# Patient Record
Sex: Female | Born: 1960 | Race: White | Hispanic: No | Marital: Married | Smoking: Never smoker
Health system: Southern US, Community
[De-identification: ages and names within clinical notes are randomized; demographics above are authoritative.]

## PROBLEM LIST (undated history)

## (undated) DIAGNOSIS — M75120 Complete rotator cuff tear or rupture of unspecified shoulder, not specified as traumatic: Secondary | ICD-10-CM

## (undated) DIAGNOSIS — Z889 Allergy status to unspecified drugs, medicaments and biological substances status: Secondary | ICD-10-CM

## (undated) DIAGNOSIS — E119 Type 2 diabetes mellitus without complications: Secondary | ICD-10-CM

## (undated) DIAGNOSIS — I10 Essential (primary) hypertension: Secondary | ICD-10-CM

## (undated) DIAGNOSIS — F419 Anxiety disorder, unspecified: Secondary | ICD-10-CM

## (undated) DIAGNOSIS — I8393 Asymptomatic varicose veins of bilateral lower extremities: Secondary | ICD-10-CM

## (undated) DIAGNOSIS — R112 Nausea with vomiting, unspecified: Secondary | ICD-10-CM

## (undated) DIAGNOSIS — Z9889 Other specified postprocedural states: Secondary | ICD-10-CM

## (undated) DIAGNOSIS — K219 Gastro-esophageal reflux disease without esophagitis: Secondary | ICD-10-CM

## (undated) DIAGNOSIS — E7800 Pure hypercholesterolemia, unspecified: Secondary | ICD-10-CM

## (undated) HISTORY — PX: HAND SURGERY: SHX662

## (undated) HISTORY — PX: WISDOM TOOTH EXTRACTION: SHX21

## (undated) HISTORY — PX: CARDIAC CATHETERIZATION: SHX172

---

## 2005-09-10 ENCOUNTER — Ambulatory Visit: Payer: Self-pay | Admitting: Unknown Physician Specialty

## 2007-05-14 ENCOUNTER — Ambulatory Visit: Payer: Self-pay | Admitting: Unknown Physician Specialty

## 2008-05-19 ENCOUNTER — Ambulatory Visit: Payer: Self-pay | Admitting: Unknown Physician Specialty

## 2009-08-09 ENCOUNTER — Ambulatory Visit: Payer: Self-pay | Admitting: Unknown Physician Specialty

## 2009-11-30 ENCOUNTER — Ambulatory Visit: Payer: Self-pay | Admitting: Gastroenterology

## 2010-09-11 ENCOUNTER — Ambulatory Visit: Payer: Self-pay | Admitting: Unknown Physician Specialty

## 2010-10-01 ENCOUNTER — Ambulatory Visit: Payer: Self-pay | Admitting: Cardiology

## 2011-07-08 ENCOUNTER — Emergency Department: Payer: Self-pay | Admitting: Emergency Medicine

## 2011-11-11 ENCOUNTER — Ambulatory Visit: Payer: Self-pay | Admitting: Unknown Physician Specialty

## 2012-11-11 ENCOUNTER — Ambulatory Visit: Payer: Self-pay | Admitting: Unknown Physician Specialty

## 2013-11-15 ENCOUNTER — Ambulatory Visit: Payer: Self-pay | Admitting: Internal Medicine

## 2014-11-24 ENCOUNTER — Ambulatory Visit: Payer: Self-pay | Admitting: Internal Medicine

## 2014-12-01 ENCOUNTER — Ambulatory Visit: Payer: Self-pay | Admitting: Otolaryngology

## 2015-08-10 ENCOUNTER — Other Ambulatory Visit: Payer: Self-pay | Admitting: Surgery

## 2015-08-10 DIAGNOSIS — M75101 Unspecified rotator cuff tear or rupture of right shoulder, not specified as traumatic: Secondary | ICD-10-CM

## 2015-08-24 ENCOUNTER — Ambulatory Visit: Payer: Self-pay

## 2015-08-29 ENCOUNTER — Ambulatory Visit
Admission: RE | Admit: 2015-08-29 | Discharge: 2015-08-29 | Disposition: A | Discharge status: Home / Self Care | Payer: BLUE CROSS/BLUE SHIELD | Source: Ambulatory Visit | Attending: Surgery | Admitting: Surgery

## 2015-08-29 DIAGNOSIS — M75101 Unspecified rotator cuff tear or rupture of right shoulder, not specified as traumatic: Secondary | ICD-10-CM

## 2015-08-29 DIAGNOSIS — M67813 Other specified disorders of tendon, right shoulder: Secondary | ICD-10-CM | POA: Diagnosis not present

## 2015-08-29 DIAGNOSIS — M25511 Pain in right shoulder: Secondary | ICD-10-CM | POA: Diagnosis present

## 2015-08-29 DIAGNOSIS — M75121 Complete rotator cuff tear or rupture of right shoulder, not specified as traumatic: Secondary | ICD-10-CM | POA: Insufficient documentation

## 2015-09-21 ENCOUNTER — Encounter
Admission: RE | Admit: 2015-09-21 | Discharge: 2015-09-21 | Disposition: A | Discharge status: Home / Self Care | Payer: BLUE CROSS/BLUE SHIELD | Source: Ambulatory Visit | Attending: Surgery | Admitting: Surgery

## 2015-09-21 DIAGNOSIS — Z01810 Encounter for preprocedural cardiovascular examination: Secondary | ICD-10-CM | POA: Insufficient documentation

## 2015-09-21 DIAGNOSIS — Z01812 Encounter for preprocedural laboratory examination: Secondary | ICD-10-CM | POA: Diagnosis not present

## 2015-09-21 HISTORY — DX: Allergy status to unspecified drugs, medicaments and biological substances: Z88.9

## 2015-09-21 HISTORY — DX: Type 2 diabetes mellitus without complications: E11.9

## 2015-09-21 HISTORY — DX: Anxiety disorder, unspecified: F41.9

## 2015-09-21 HISTORY — DX: Gastro-esophageal reflux disease without esophagitis: K21.9

## 2015-09-21 HISTORY — DX: Essential (primary) hypertension: I10

## 2015-09-21 LAB — CBC
HCT: 42.6 % (ref 35.0–47.0)
Hemoglobin: 14 g/dL (ref 12.0–16.0)
MCH: 29.5 pg (ref 26.0–34.0)
MCHC: 32.8 g/dL (ref 32.0–36.0)
MCV: 90 fL (ref 80.0–100.0)
PLATELETS: 221 10*3/uL (ref 150–440)
RBC: 4.73 MIL/uL (ref 3.80–5.20)
RDW: 13.3 % (ref 11.5–14.5)
WBC: 10.8 10*3/uL (ref 3.6–11.0)

## 2015-09-21 LAB — BASIC METABOLIC PANEL
Anion gap: 10 (ref 5–15)
BUN: 24 mg/dL — AB (ref 6–20)
CHLORIDE: 102 mmol/L (ref 101–111)
CO2: 28 mmol/L (ref 22–32)
CREATININE: 0.91 mg/dL (ref 0.44–1.00)
Calcium: 9.8 mg/dL (ref 8.9–10.3)
GFR calc Af Amer: >60 mL/min (ref >60)
GLUCOSE: 122 mg/dL — AB (ref 65–99)
POTASSIUM: 3.8 mmol/L (ref 3.5–5.1)
Sodium: 140 mmol/L (ref 135–145)

## 2015-09-21 NOTE — Patient Instructions (Signed)
  Your procedure is scheduled on: September 28, 2015 (Thursday) Report to Day Surgery.[facility]) To find out your arrival time please call [phone] between 1PM - 3PM on September 27, 2015 (Wednesday).  Remember: Instructions that are not followed completely may result in serious medical risk, up to and including death, or upon the discretion of your surgeon and anesthesiologist your surgery may need to be rescheduled.    _x___ 1. Do not eat food or drink liquids after midnight. No gum chewing or hard candies.     _x___ 2. No Alcohol for 24 hours before or after surgery.   ____ 3. Bring all medications with you on the day of surgery if instructed.    _x___ 4. Notify your doctor if there is any change in your medical condition     (cold, fever, infections).     Do not wear jewelry, make-up, hairpins, clips or nail polish.  Do not wear lotions, powders, or perfumes. You may wear deodorant.  Do not shave 48 hours prior to surgery. Men may shave face and neck.  Do not bring valuables to the hospital.    [facility] is not responsible for any belongings or valuables.               Contacts, dentures or bridgework may not be worn into surgery.  Leave your suitcase in the car. After surgery it may be brought to your room.  For patients admitted to the hospital, discharge time is determined by your                treatment team.   Patients discharged the day of surgery will not be allowed to drive home.   Please read over the following fact sheets that you were given:   Surgical Site Infection Prevention   ____ Take these medicines the morning of surgery with A SIP OF WATER:    1. Metoprolol  2. Simvastatin  3. Ranitidine  4.Valsartan  5.  6.  ____ Fleet Enema (as directed)   __x__ Use CHG Soap as directed  ____ Use inhalers on the day of surgery  ____ Stop metformin 2 days prior to surgery    __x__ Take 1/2 of usual insulin dose the night before surgery and none on the  morning of surgery. (LEAVE INSULIN PUMP AS USUAL)  __x__ Stop Coumadin/Plavix/aspirin on (Patient has stopped Aspirin)  __x__ Stop Anti-inflammatories on (Tylenol ok to take for pain)   ____ Stop supplements until after surgery.    ____ Bring C-Pap to the hospital.

## 2015-09-21 NOTE — Pre-Procedure Instructions (Addendum)
As instructed by Dr Henrene HawkingKephart, request for medical clearance regarding ekg called and faxed to Dr Algis LimingJ Singh. Spoke with  [employer]. Also info to Dr Joice LoftsPoggi and [facility]

## 2015-09-23 LAB — LATEX, IGE: Latex: <0.1 kU/L

## 2015-09-27 NOTE — Pre-Procedure Instructions (Signed)
Spoke with Dawn at Dr Thedore MinsSingh office re status of clearance

## 2015-09-28 ENCOUNTER — Ambulatory Visit: Payer: BLUE CROSS/BLUE SHIELD | Admitting: Anesthesiology

## 2015-09-28 ENCOUNTER — Ambulatory Visit
Admission: RE | Admit: 2015-09-28 | Discharge: 2015-09-28 | Disposition: A | Discharge status: Home / Self Care | Payer: BLUE CROSS/BLUE SHIELD | Source: Ambulatory Visit | Attending: Surgery | Admitting: Surgery

## 2015-09-28 ENCOUNTER — Encounter: Payer: Self-pay | Admitting: Anesthesiology

## 2015-09-28 ENCOUNTER — Encounter
Admission: RE | Disposition: A | Discharge status: Home / Self Care | Payer: Self-pay | Source: Ambulatory Visit | Attending: Surgery

## 2015-09-28 DIAGNOSIS — E7800 Pure hypercholesterolemia, unspecified: Secondary | ICD-10-CM | POA: Insufficient documentation

## 2015-09-28 DIAGNOSIS — Z87891 Personal history of nicotine dependence: Secondary | ICD-10-CM | POA: Diagnosis not present

## 2015-09-28 DIAGNOSIS — Z91048 Other nonmedicinal substance allergy status: Secondary | ICD-10-CM | POA: Insufficient documentation

## 2015-09-28 DIAGNOSIS — E785 Hyperlipidemia, unspecified: Secondary | ICD-10-CM | POA: Diagnosis not present

## 2015-09-28 DIAGNOSIS — N183 Chronic kidney disease, stage 3 (moderate): Secondary | ICD-10-CM | POA: Insufficient documentation

## 2015-09-28 DIAGNOSIS — K219 Gastro-esophageal reflux disease without esophagitis: Secondary | ICD-10-CM | POA: Insufficient documentation

## 2015-09-28 DIAGNOSIS — Z9641 Presence of insulin pump (external) (internal): Secondary | ICD-10-CM | POA: Diagnosis not present

## 2015-09-28 DIAGNOSIS — E1022 Type 1 diabetes mellitus with diabetic chronic kidney disease: Secondary | ICD-10-CM | POA: Insufficient documentation

## 2015-09-28 DIAGNOSIS — S46111A Strain of muscle, fascia and tendon of long head of biceps, right arm, initial encounter: Secondary | ICD-10-CM | POA: Diagnosis not present

## 2015-09-28 DIAGNOSIS — Z888 Allergy status to other drugs, medicaments and biological substances status: Secondary | ICD-10-CM | POA: Diagnosis not present

## 2015-09-28 DIAGNOSIS — W19XXXA Unspecified fall, initial encounter: Secondary | ICD-10-CM | POA: Diagnosis not present

## 2015-09-28 DIAGNOSIS — Z881 Allergy status to other antibiotic agents status: Secondary | ICD-10-CM | POA: Diagnosis not present

## 2015-09-28 DIAGNOSIS — I129 Hypertensive chronic kidney disease with stage 1 through stage 4 chronic kidney disease, or unspecified chronic kidney disease: Secondary | ICD-10-CM | POA: Insufficient documentation

## 2015-09-28 DIAGNOSIS — Z7982 Long term (current) use of aspirin: Secondary | ICD-10-CM | POA: Insufficient documentation

## 2015-09-28 DIAGNOSIS — M75101 Unspecified rotator cuff tear or rupture of right shoulder, not specified as traumatic: Secondary | ICD-10-CM | POA: Insufficient documentation

## 2015-09-28 DIAGNOSIS — M7541 Impingement syndrome of right shoulder: Secondary | ICD-10-CM | POA: Insufficient documentation

## 2015-09-28 HISTORY — DX: Pure hypercholesterolemia, unspecified: E78.00

## 2015-09-28 HISTORY — PX: SHOULDER ARTHROSCOPY WITH OPEN ROTATOR CUFF REPAIR: SHX6092

## 2015-09-28 LAB — GLUCOSE, CAPILLARY
GLUCOSE-CAPILLARY: 167 mg/dL — AB (ref 65–99)
GLUCOSE-CAPILLARY: 239 mg/dL — AB (ref 65–99)

## 2015-09-28 SURGERY — ARTHROSCOPY, SHOULDER WITH REPAIR, ROTATOR CUFF, OPEN
Anesthesia: General | Site: Shoulder | Laterality: Right | Wound class: Clean

## 2015-09-28 MED ORDER — SUCCINYLCHOLINE CHLORIDE 20 MG/ML IJ SOLN
INTRAMUSCULAR | Status: DC | PRN
  Administered 2015-09-28: 100 mg via INTRAVENOUS

## 2015-09-28 MED ORDER — ROCURONIUM BROMIDE 100 MG/10ML IV SOLN
INTRAVENOUS | Status: DC | PRN
  Administered 2015-09-28: 10 mg via INTRAVENOUS
  Administered 2015-09-28: 30 mg via INTRAVENOUS

## 2015-09-28 MED ORDER — ONDANSETRON HCL 4 MG/2ML IJ SOLN
INTRAMUSCULAR | Status: DC | PRN
  Administered 2015-09-28: 4 mg via INTRAVENOUS

## 2015-09-28 MED ORDER — MIDAZOLAM HCL 5 MG/5ML IJ SOLN: 1.0000 mg | Freq: Once | INTRAMUSCULAR | Status: DC

## 2015-09-28 MED ORDER — EPHEDRINE SULFATE 50 MG/ML IJ SOLN
INTRAMUSCULAR | Status: DC | PRN
  Administered 2015-09-28 (×2): 10 mg via INTRAVENOUS

## 2015-09-28 MED ORDER — POTASSIUM CHLORIDE IN NACL 20-0.9 MEQ/L-% IV SOLN: INTRAVENOUS | Status: DC

## 2015-09-28 MED ORDER — ONDANSETRON HCL 4 MG PO TABS: 4.0000 mg | ORAL_TABLET | Freq: Four times a day (QID) | ORAL | Status: DC | PRN

## 2015-09-28 MED ORDER — PROPOFOL 10 MG/ML IV BOLUS
INTRAVENOUS | Status: DC | PRN
  Administered 2015-09-28: 150 mg via INTRAVENOUS

## 2015-09-28 MED ORDER — FENTANYL CITRATE (PF) 100 MCG/2ML IJ SOLN
25.0000 ug | Freq: Once | INTRAMUSCULAR | Status: AC
Start: 2015-09-28 — End: 2015-09-28
  Administered 2015-09-28: 25 ug via INTRAVENOUS

## 2015-09-28 MED ORDER — SODIUM CHLORIDE 0.9 % IV SOLN
INTRAVENOUS | Status: DC
  Administered 2015-09-28: 09:00:00 via INTRAVENOUS

## 2015-09-28 MED ORDER — INSULIN ASPART 100 UNIT/ML ~~LOC~~ SOLN
SUBCUTANEOUS | Status: AC
  Filled 2015-09-28: qty 4

## 2015-09-28 MED ORDER — INSULIN ASPART 100 UNIT/ML ~~LOC~~ SOLN
4.0000 [IU] | Freq: Once | SUBCUTANEOUS | Status: AC
  Administered 2015-09-28: 4 [IU] via SUBCUTANEOUS

## 2015-09-28 MED ORDER — ROPIVACAINE HCL 5 MG/ML IJ SOLN
INTRAMUSCULAR | Status: AC
  Filled 2015-09-28: qty 20

## 2015-09-28 MED ORDER — CEFAZOLIN SODIUM-DEXTROSE 2-3 GM-% IV SOLR
INTRAVENOUS | Status: AC
  Filled 2015-09-28: qty 50

## 2015-09-28 MED ORDER — METOCLOPRAMIDE HCL 5 MG/ML IJ SOLN: 5.0000 mg | Freq: Three times a day (TID) | INTRAMUSCULAR | Status: DC | PRN

## 2015-09-28 MED ORDER — PHENYLEPHRINE HCL 10 MG/ML IJ SOLN
10000.0000 ug | INTRAMUSCULAR | Status: DC | PRN
  Administered 2015-09-28 (×3): 100 ug via INTRAVENOUS

## 2015-09-28 MED ORDER — BUPIVACAINE-EPINEPHRINE 0.5% -1:200000 IJ SOLN
INTRAMUSCULAR | Status: DC | PRN
  Administered 2015-09-28: 30 mL

## 2015-09-28 MED ORDER — ROPIVACAINE HCL 2 MG/ML IJ SOLN
INTRAMUSCULAR | Status: AC
  Filled 2015-09-28: qty 20

## 2015-09-28 MED ORDER — LIDOCAINE HCL (CARDIAC) 20 MG/ML IV SOLN
INTRAVENOUS | Status: DC | PRN
  Administered 2015-09-28: 80 mg via INTRAVENOUS

## 2015-09-28 MED ORDER — MIDAZOLAM HCL 5 MG/5ML IJ SOLN
INTRAMUSCULAR | Status: AC
  Administered 2015-09-28: 1 mg
  Filled 2015-09-28: qty 5

## 2015-09-28 MED ORDER — FENTANYL CITRATE (PF) 100 MCG/2ML IJ SOLN
50.0000 ug | Freq: Once | INTRAMUSCULAR | Status: DC
  Administered 2015-09-28: 100 ug via INTRAVENOUS

## 2015-09-28 MED ORDER — ACETAMINOPHEN 10 MG/ML IV SOLN
INTRAVENOUS | Status: AC
  Filled 2015-09-28: qty 100

## 2015-09-28 MED ORDER — ONDANSETRON HCL 4 MG/2ML IJ SOLN: 4.0000 mg | Freq: Once | INTRAMUSCULAR | Status: DC | PRN

## 2015-09-28 MED ORDER — SUGAMMADEX SODIUM 500 MG/5ML IV SOLN
INTRAVENOUS | Status: DC | PRN
  Administered 2015-09-28: 218.6 mg via INTRAVENOUS

## 2015-09-28 MED ORDER — OXYCODONE HCL 5 MG PO TABS
5.0000 mg | ORAL_TABLET | ORAL | Status: DC | PRN
  Administered 2015-09-28: 5 mg via ORAL

## 2015-09-28 MED ORDER — EPINEPHRINE HCL 1 MG/ML IJ SOLN
INTRAMUSCULAR | Status: DC | PRN
  Administered 2015-09-28: 2 mg

## 2015-09-28 MED ORDER — BUPIVACAINE-EPINEPHRINE (PF) 0.5% -1:200000 IJ SOLN
INTRAMUSCULAR | Status: AC
  Filled 2015-09-28: qty 30

## 2015-09-28 MED ORDER — ONDANSETRON 4 MG PO TBDP: 4.0000 mg | ORAL_TABLET | Freq: Three times a day (TID) | ORAL | Status: DC | PRN

## 2015-09-28 MED ORDER — OXYCODONE HCL 5 MG PO TABS: 5.0000 mg | ORAL_TABLET | ORAL | Status: DC | PRN

## 2015-09-28 MED ORDER — EPINEPHRINE HCL 1 MG/ML IJ SOLN
INTRAMUSCULAR | Status: AC
  Filled 2015-09-28: qty 1

## 2015-09-28 MED ORDER — FENTANYL CITRATE (PF) 250 MCG/5ML IJ SOLN
INTRAMUSCULAR | Status: DC | PRN
  Administered 2015-09-28 (×2): 50 ug via INTRAVENOUS

## 2015-09-28 MED ORDER — FENTANYL CITRATE (PF) 100 MCG/2ML IJ SOLN
25.0000 ug | INTRAMUSCULAR | Status: AC | PRN
  Administered 2015-09-28: 25 ug via INTRAVENOUS
  Administered 2015-09-28: 50 ug via INTRAVENOUS
  Administered 2015-09-28 (×4): 25 ug via INTRAVENOUS

## 2015-09-28 MED ORDER — ONDANSETRON HCL 4 MG/2ML IJ SOLN: 4.0000 mg | Freq: Four times a day (QID) | INTRAMUSCULAR | Status: DC | PRN

## 2015-09-28 MED ORDER — MIDAZOLAM HCL 5 MG/5ML IJ SOLN
INTRAMUSCULAR | Status: DC | PRN
  Administered 2015-09-28: 1 mg via INTRAVENOUS

## 2015-09-28 MED ORDER — FENTANYL CITRATE (PF) 100 MCG/2ML IJ SOLN
INTRAMUSCULAR | Status: AC
  Administered 2015-09-28: 100 ug via INTRAVENOUS
  Filled 2015-09-28: qty 2

## 2015-09-28 MED ORDER — FENTANYL CITRATE (PF) 100 MCG/2ML IJ SOLN
INTRAMUSCULAR | Status: AC
  Administered 2015-09-28: 25 ug via INTRAVENOUS
  Filled 2015-09-28: qty 2

## 2015-09-28 MED ORDER — CEFAZOLIN SODIUM-DEXTROSE 2-3 GM-% IV SOLR
2.0000 g | Freq: Once | INTRAVENOUS | Status: AC
  Administered 2015-09-28: 2 g via INTRAVENOUS

## 2015-09-28 MED ORDER — FENTANYL CITRATE (PF) 100 MCG/2ML IJ SOLN
INTRAMUSCULAR | Status: AC
  Administered 2015-09-28: 50 ug via INTRAVENOUS
  Filled 2015-09-28: qty 2

## 2015-09-28 MED ORDER — ACETAMINOPHEN 10 MG/ML IV SOLN
INTRAVENOUS | Status: DC | PRN
  Administered 2015-09-28: 1000 mg via INTRAVENOUS

## 2015-09-28 MED ORDER — OXYCODONE HCL 5 MG PO TABS
ORAL_TABLET | ORAL | Status: AC
  Administered 2015-09-28: 5 mg via ORAL
  Filled 2015-09-28: qty 1

## 2015-09-28 MED ORDER — METOCLOPRAMIDE HCL 10 MG PO TABS: 5.0000 mg | ORAL_TABLET | Freq: Three times a day (TID) | ORAL | Status: DC | PRN

## 2015-09-28 SURGICAL SUPPLY — 54 items
ANCHOR SUT BIO SW 4.75X19.1 (Anchor) ×2 IMPLANT
BIT DRILL JUGRKNT W/NDL BIT2.9 (DRILL) IMPLANT
BLADE FULL RADIUS 3.5 (BLADE) ×2 IMPLANT
BLADE SHAVER 4.5X7 STR FR (MISCELLANEOUS) IMPLANT
BUR ACROMIONIZER 4.0 (BURR) ×2 IMPLANT
BUR BR 5.5 WIDE MOUTH (BURR) IMPLANT
CANNULA 8.5X75 THRED (CANNULA) ×2 IMPLANT
CANNULA SHAVER 8MMX76MM (CANNULA) IMPLANT
CHLORAPREP W/TINT 26ML (MISCELLANEOUS) ×4 IMPLANT
COVER MAYO STAND STRL (DRAPES) ×2 IMPLANT
DRAPE IMP U-DRAPE 54X76 (DRAPES) ×2 IMPLANT
DRAPE SURG 17X11 SM STRL (DRAPES) ×2 IMPLANT
DRILL JUGGERKNOT W/NDL BIT 2.9 (DRILL)
DRSG OPSITE POSTOP 4X8 (GAUZE/BANDAGES/DRESSINGS) ×2 IMPLANT
GAUZE PETRO XEROFOAM 1X8 (MISCELLANEOUS) ×2 IMPLANT
GAUZE SPONGE 4X4 12PLY STRL (GAUZE/BANDAGES/DRESSINGS) ×2 IMPLANT
GLOVE BIO SURGEON STRL SZ7.5 (GLOVE) ×4 IMPLANT
GLOVE BIO SURGEON STRL SZ8 (GLOVE) ×4 IMPLANT
GLOVE BIOGEL PI IND STRL 8 (GLOVE) ×1 IMPLANT
GLOVE BIOGEL PI INDICATOR 8 (GLOVE) ×1
GLOVE INDICATOR 8.0 STRL GRN (GLOVE) ×2 IMPLANT
GOWN STRL REUS W/ TWL LRG LVL3 (GOWN DISPOSABLE) ×2 IMPLANT
GOWN STRL REUS W/ TWL XL LVL3 (GOWN DISPOSABLE) ×1 IMPLANT
GOWN STRL REUS W/TWL LRG LVL3 (GOWN DISPOSABLE) ×2
GOWN STRL REUS W/TWL XL LVL3 (GOWN DISPOSABLE) ×1
GRASPER SUT 15 45D LOW PRO (SUTURE) IMPLANT
IMP SYSTEM BRIDGE 4.75X19.1 (SYSTAGENIX WOUND MANAGEMENT) ×2
IMPL SYSTEM BRIDGE 4.75X19.1 (SYSTAGENIX WOUND MANAGEMENT) ×1 IMPLANT
IV LACTATED RINGER IRRG 3000ML (IV SOLUTION) ×2
IV LR IRRIG 3000ML ARTHROMATIC (IV SOLUTION) ×2 IMPLANT
MANIFOLD NEPTUNE II (INSTRUMENTS) ×2 IMPLANT
MASK FACE SPIDER DISP (MASK) ×2 IMPLANT
MAT BLUE FLOOR 46X72 FLO (MISCELLANEOUS) ×2 IMPLANT
NDL SUREFIRE SCORPION (NEEDLE) ×2 IMPLANT
NEEDLE MAYO 6 CRC TAPER PT (NEEDLE) ×2 IMPLANT
NEEDLE REVERSE CUT 1/2 CRC (NEEDLE) IMPLANT
PACK ARTHROSCOPY SHOULDER (MISCELLANEOUS) ×2 IMPLANT
PAD GROUND ADULT SPLIT (MISCELLANEOUS) ×2 IMPLANT
SLING ARM LRG DEEP (SOFTGOODS) IMPLANT
SLING ULTRA II LG (MISCELLANEOUS) ×2 IMPLANT
STAPLER SKIN PROX 35W (STAPLE) ×2 IMPLANT
STRAP SAFETY BODY (MISCELLANEOUS) ×2 IMPLANT
SUT ETHIBOND 0 MO6 C/R (SUTURE) ×2 IMPLANT
SUT FIBERWIRE #2 38 T-5 BLUE (SUTURE) ×2
SUT PROLENE 4 0 PS 2 18 (SUTURE) IMPLANT
SUT VIC AB 2-0 CT1 27 (SUTURE) ×2
SUT VIC AB 2-0 CT1 TAPERPNT 27 (SUTURE) ×2 IMPLANT
SUTURE FIBERWR #2 38 T-5 BLUE (SUTURE) ×1 IMPLANT
SpeedBridge ×2 IMPLANT
TAPE MICROFOAM 4IN (TAPE) ×2 IMPLANT
TISSUE ARTHROFLEX DERMUS 35X35 (Tissue) ×2 IMPLANT
TUBING ARTHRO INFLOW-ONLY STRL (TUBING) ×2 IMPLANT
TUBING CONNECTING 10 (TUBING) ×2 IMPLANT
WAND HAND CNTRL MULTIVAC 90 (MISCELLANEOUS) ×2 IMPLANT

## 2015-09-28 NOTE — H&P (Signed)
Paper H&P to be scanned into permanent record. H&P reviewed. No changes. 

## 2015-09-28 NOTE — Transfer of Care (Signed)
Immediate Anesthesia Transfer of Care Note  Patient: Tiffany Ellis  Procedure(s) Performed: Procedure(s): SHOULDER ARTHROSCOPY WITH DEBRIDEMENT, SUBACROMIAL DECOMPRESSION AND MINI OPEN ROTATOR CUFF REPAIR, BICEPS TENODESIS (Right)  Patient Location: PACU  Anesthesia Type:General  Level of Consciousness: awake, alert  and oriented  Airway & Oxygen Therapy: Patient Spontanous Breathing and Patient connected to nasal cannula oxygen  Post-op Assessment: Report given to RN and Post -op Vital signs reviewed and stable  Post vital signs: Reviewed and stable  Last Vitals: 149/75 98% sat 86 hr 22 resp 239 blood sugar 99.6 temp Filed Vitals:   09/28/15 0926 09/28/15 0941  BP: 152/60 133/72  Pulse: 93 87  Temp:    Resp: 23 13    Complications: No apparent anesthesia complications

## 2015-09-28 NOTE — Anesthesia Procedure Notes (Addendum)
Procedure Name: Intubation Date/Time: 09/28/2015 9:52 AM Performed by: Chong SicilianLOPEZ, IVETTE Pre-anesthesia Checklist: Patient identified, Emergency Drugs available, Suction available, Patient being monitored and Timeout performed Patient Re-evaluated:Patient Re-evaluated prior to inductionOxygen Delivery Method: Circle system utilized Preoxygenation: Pre-oxygenation with 100% oxygen Intubation Type: IV induction Ventilation: Mask ventilation without difficulty Laryngoscope Size: Mac and 3 Grade View: Grade I Tube type: Oral Tube size: 7.0 mm Number of attempts: 1 Airway Equipment and Method: Stylet Placement Confirmation: ETT inserted through vocal cords under direct vision,  positive ETCO2 and breath sounds checked- equal and bilateral Secured at: 21 cm Tube secured with: Tape Dental Injury: Teeth and Oropharynx as per pre-operative assessment    Anesthesia Regional Block:  Interscalene brachial plexus block  Pre-Anesthetic Checklist: ,, timeout performed, Correct Patient, Correct Site, Correct Laterality, Correct Procedure, Correct Position, site marked, Risks and benefits discussed,  Surgical consent,  Pre-op evaluation,  At surgeon's request and post-op pain management  Laterality: Right  Prep: Maximum Sterile Barrier Precautions used and chloraprep       Needles:  Injection technique: Single-shot  Needle Type: Stimiplex     Needle Length: 5cm 5 cm Needle Gauge: 22 and 22 G    Additional Needles:  Procedures: ultrasound guided (picture in chart) and nerve stimulator  Motor weakness within 20 minutes. Interscalene brachial plexus block  Nerve Stimulator or Paresthesia:  Response: 0.5 mA,   Additional Responses:   Narrative:  Start time: 09/28/2015 9:15 AM End time: 09/28/2015 9:25 AM Injection made incrementally with aspirations every 5 mL.  Performed by: Other  Anesthesiologist: Yves DillARROLL, Tayron Hunnell  Additional Notes: Patient in semi-reclined position.  Rolls placed  under the left back.   The right neck was prepped and draped.  An ultrasound probe was used to visualize the brachial plexus at C6.  A skin wheal was made at the lateral edge of the probe with 1% Lidocaine plain and a 22G stimuplex needle was advanced to the junction just before the C5 and C6 nerve roots and the stimulator was used in conjunction with a stimulus down to 0.5 milliamps.  Incremental injection of a mix of 0.2% + 0.5% Ropivacaine plain was injected with no pain and a nice spread around the nerves. The patient tolerated the procedure well

## 2015-09-28 NOTE — Discharge Instructions (Addendum)
Keep dressing dry and intact.  May change dressing on post-op day #4 (Monday) and sponge bathe over incisions.  Cover staples with Band-Aids after drying off. Apply ice frequently to shoulder. Keep shoulder immobilizer on at all times except may remove for bathing purposes. Follow-up in 10-14 days or as scheduled.   AMBULATORY SURGERY  DISCHARGE INSTRUCTIONS   1) The drugs that you were given will stay in your system until tomorrow so for the next 24 hours you should not:  A) Drive an automobile B) Make any legal decisions C) Drink any alcoholic beverage   2) You may resume regular meals tomorrow.  Today it is better to start with liquids and gradually work up to solid foods.  You may eat anything you prefer, but it is better to start with liquids, then soup and crackers, and gradually work up to solid foods.   3) Please notify your doctor immediately if you have any unusual bleeding, trouble breathing, redness and pain at the surgery site, drainage, fever, or pain not relieved by medication.    4) Additional Instructions:        Please contact your physician with any problems or Same Day Surgery at [phone], Monday through Friday 6 am to 4 pm, or [place] at [facility] Main number at [phone].

## 2015-09-28 NOTE — Op Note (Signed)
09/28/2015  1:01 PM  Patient:   Tiffany Ellis  Pre-Op Diagnosis:   Impingement/tendinopathy with massive rotator cuff tear, right shoulder.  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  Procedure: Limited arthroscopic debridement, arthroscopic subacromial decompression, mini-open repair of massive rotator cuff tear, right shoulder.  Anesthesia: General endotracheal with interscalene block placed preoperatively by the anesthesiologist.  Surgeon:   Maryagnes Amos, MD  Assistant:   Horris Latino, PA-C  Findings: As above. There was a massive retracted tear of the rotator cuff including the entire supraspinatus and infraspinatus tendons, as well as the anterior portion of the teres minor. In addition, there was degenerative tearing of the superior portion of the subscapularis tendon anteriorly. The biceps tendon had torn chronically and was retracted distally. The labrum demonstrated some fraying but otherwise was intact to probing. No significant degenerative changes of either the glenoid or humeral articular surfaces were noted.  Complications: None  Fluids:   1000 cc  Estimated blood loss: 20 cc  Tourniquet time: None  Drains: None  Closure: Staples   Brief clinical note: The patient is a 54 year old female with multiple medical problems who apparently tripped and fell approximate 6-7 weeks ago injuring her right shoulder. Following the injury, she was unable to elevate her arm and noted significant pain. An MRI scan was obtained which confirmed the presence of a massive rotator cuff tear. The patient presents at this time for definitive management of these shoulder symptoms.  Procedure: The patient underwent placement of an interscalene block in the preoperative holding area before she was brought into the operating room and lain in the supine position. After adequate general  endotracheal intubation and anesthesia were obtained, the patient was repositioned in the beach chair position using the beach chair positioner. The right shoulder and upper extremity were prepped with ChloraPrep solution before being draped sterilely. Preoperative antibiotics were administered. A timeout was performed to confirm the proper surgical site before the expected portal sites and incision site were injected with 0.5% Sensorcaine with epinephrine. A posterior portal was created and the glenohumeral joint thoroughly inspected with the findings as described above. An anterior portal was created using an outside-in technique. The labrum and rotator cuff were further probed, again confirming the above-noted findings. The areas of degenerative fraying of the labrum were debrided back to stable margins, as were the torn and frayed margins of the rotator cuff tear. The ArthroCare wand was inserted and used to obtain hemostasis as well as to "anneal" the labrum superiorly and anteriorly. The instruments were removed from the joint after suctioning the excess fluid.  The camera was repositioned through the posterior portal into the subacromial space. A separate lateral portal was created using an outside-in technique. The 3.5 mm full-radius resector was introduced and used to perform a subtotal bursectomy. The ArthroCare wand was then inserted and used to remove the periosteal tissue off the undersurface of the anterior third of the acromion as well as to recess the coracoacromial ligament from its attachment along the anterior and lateral margins of the acromion. The 4.0 mm acromionizing bur was introduced and used to complete the decompression by removing the undersurface of the anterior third of the acromion. A prominent infraclavicular osteophyte also was debrided using the 4 mm acromionizer bur. The full radius resector was reintroduced to remove any residual bony debris before the ArthroCare wand was  reintroduced to obtain hemostasis. The instruments were then removed from the  subacromial space after suctioning the excess fluid.  An approximately 4-5 cm incision was made over the anterolateral aspect of the shoulder beginning at the anterolateral corner of the acromion and extending distally in line with the bicipital groove. This incision was carried down through the subcutaneous tissues to expose the deltoid fascia. The raphae between the anterior and middle thirds was identified and this plane developed to provide access into the subacromial space. Additional bursal tissues were debrided sharply using Metzenbaum scissors. The rotator cuff tear was readily identified. Despite extensive releases anteriorly, superiorly, and posteriorly, the rotator cuff could not be fully mobilized to the greater tuberosity. Therefore, it was elected to use an Arthrex allograft dermal patch to complete the repair. This patch was shaped to the appropriate size and secured using multiple #0 Ethibond interrupted sutures which had been placed in the rotator cuff and passed through the patch to secure the patch to the remaining cuff margin. The patch was secured laterally using two sets of SwiveLock anchors. The first set was placed at the articular margin while the second set was placed more laterally to effect a two layer closure. A fifth anchor was placed more posteriorly to stabilize the posterior margin of the graft. Anteriorly, the graft was sutured into the superior margin of the subscapularis tendon. An apparent watertight closure was obtained.  The wound was copiously irrigated with sterile saline solution before the deltoid raphae was reapproximated using 2-0 Vicryl interrupted sutures. The subcutaneous tissues were closed in two layers using 2-0 Vicryl interrupted sutures before the skin was closed using staples. The portal sites also were closed using staples. A sterile bulky dressing was applied to the shoulder  before the arm was placed into a shoulder immobilizer. The patient was then awakened, extubated, and returned to the recovery room in satisfactory condition after tolerating the procedure well.

## 2015-09-28 NOTE — Progress Notes (Signed)
Pt to OR via stretcher.

## 2015-09-28 NOTE — Anesthesia Preprocedure Evaluation (Addendum)
Anesthesia Evaluation  Patient identified by MRN, date of birth, ID band Patient awake    Reviewed: Allergy & Precautions, NPO status , Patient's Chart, lab work & pertinent test results, reviewed documented beta blocker date and time   Airway Mallampati: II  TM Distance: >3 FB     Dental no notable dental hx.    Pulmonary neg pulmonary ROS,    Pulmonary exam normal        Cardiovascular hypertension, Pt. on medications and Pt. on home beta blockers Normal cardiovascular exam     Neuro/Psych Anxiety negative neurological ROS     GI/Hepatic Neg liver ROS, GERD  Medicated and Controlled,  Endo/Other  diabetes, Well Controlled, Type 2, Oral Hypoglycemic Agents  Renal/GU negative Renal ROS  negative genitourinary   Musculoskeletal negative musculoskeletal ROS (+)   Abdominal Normal abdominal exam  (+)   Peds negative pediatric ROS (+)  Hematology negative hematology ROS (+)   Anesthesia Other Findings   Reproductive/Obstetrics                          Anesthesia Physical Anesthesia Plan  ASA: II  Anesthesia Plan: General   Post-op Pain Management:    Induction: Intravenous  Airway Management Planned: Oral ETT  Additional Equipment:   Intra-op Plan:   Post-operative Plan: Extubation in OR  Informed Consent: I have reviewed the patients History and Physical, chart, labs and discussed the procedure including the risks, benefits and alternatives for the proposed anesthesia with the patient or authorized representative who has indicated his/her understanding and acceptance.   Dental advisory given  Plan Discussed with: CRNA and Surgeon  Anesthesia Plan Comments:         Anesthesia Quick Evaluation

## 2015-09-29 ENCOUNTER — Encounter: Payer: Self-pay | Admitting: Surgery

## 2015-09-29 NOTE — Anesthesia Postprocedure Evaluation (Signed)
Anesthesia Post Note  Patient: Tiffany Ellis  Procedure(s) Performed: Procedure(s) (LRB): SHOULDER ARTHROSCOPY WITH DEBRIDEMENT, SUBACROMIAL DECOMPRESSION AND MINI OPEN ROTATOR CUFF REPAIR, BICEPS TENODESIS (Right)  Patient location during evaluation: PACU Anesthesia Type: General Level of consciousness: awake and alert and oriented Pain management: pain level controlled Vital Signs Assessment: post-procedure vital signs reviewed and stable Respiratory status: spontaneous breathing Cardiovascular status: blood pressure returned to baseline Anesthetic complications: no    Last Vitals:  Filed Vitals:   09/28/15 1422 09/28/15 1445  BP: 140/46 146/58  Pulse: 102 98  Temp: 37.2 C   Resp: 18 18    Last Pain:  Filed Vitals:   09/29/15 0830  PainSc: 3                  [name]

## 2016-04-29 ENCOUNTER — Other Ambulatory Visit: Payer: Self-pay | Admitting: Internal Medicine

## 2016-04-29 DIAGNOSIS — Z1239 Encounter for other screening for malignant neoplasm of breast: Secondary | ICD-10-CM

## 2016-05-16 ENCOUNTER — Ambulatory Visit: Payer: BLUE CROSS/BLUE SHIELD

## 2016-05-23 ENCOUNTER — Ambulatory Visit
Admission: RE | Admit: 2016-05-23 | Discharge: 2016-05-23 | Disposition: A | Discharge status: Home / Self Care | Payer: BLUE CROSS/BLUE SHIELD | Source: Ambulatory Visit | Attending: Internal Medicine | Admitting: Internal Medicine

## 2016-05-23 ENCOUNTER — Other Ambulatory Visit: Payer: Self-pay | Admitting: Internal Medicine

## 2016-05-23 DIAGNOSIS — Z1231 Encounter for screening mammogram for malignant neoplasm of breast: Secondary | ICD-10-CM | POA: Diagnosis not present

## 2016-05-23 DIAGNOSIS — Z1239 Encounter for other screening for malignant neoplasm of breast: Secondary | ICD-10-CM

## 2017-07-18 ENCOUNTER — Other Ambulatory Visit: Payer: Self-pay | Admitting: Internal Medicine

## 2017-07-18 DIAGNOSIS — Z1231 Encounter for screening mammogram for malignant neoplasm of breast: Secondary | ICD-10-CM

## 2017-07-25 ENCOUNTER — Ambulatory Visit
Admission: RE | Admit: 2017-07-25 | Discharge: 2017-07-25 | Disposition: A | Discharge status: Home / Self Care | Payer: BLUE CROSS/BLUE SHIELD | Source: Ambulatory Visit | Attending: Internal Medicine | Admitting: Internal Medicine

## 2017-07-25 DIAGNOSIS — Z1231 Encounter for screening mammogram for malignant neoplasm of breast: Secondary | ICD-10-CM | POA: Insufficient documentation

## 2018-08-10 ENCOUNTER — Other Ambulatory Visit: Payer: Self-pay | Admitting: Internal Medicine

## 2018-08-10 DIAGNOSIS — Z1231 Encounter for screening mammogram for malignant neoplasm of breast: Secondary | ICD-10-CM

## 2018-08-12 ENCOUNTER — Other Ambulatory Visit: Payer: Self-pay | Admitting: Internal Medicine

## 2018-08-12 DIAGNOSIS — Z0000 Encounter for general adult medical examination without abnormal findings: Secondary | ICD-10-CM

## 2018-08-18 ENCOUNTER — Ambulatory Visit
Admission: RE | Admit: 2018-08-18 | Discharge: 2018-08-18 | Disposition: A | Discharge status: Home / Self Care | Payer: BLUE CROSS/BLUE SHIELD | Source: Ambulatory Visit | Attending: Internal Medicine | Admitting: Internal Medicine

## 2018-08-18 DIAGNOSIS — R748 Abnormal levels of other serum enzymes: Secondary | ICD-10-CM | POA: Diagnosis not present

## 2018-08-18 DIAGNOSIS — Z0000 Encounter for general adult medical examination without abnormal findings: Secondary | ICD-10-CM

## 2018-09-16 ENCOUNTER — Ambulatory Visit
Admission: RE | Admit: 2018-09-16 | Discharge: 2018-09-16 | Disposition: A | Discharge status: Home / Self Care | Payer: BLUE CROSS/BLUE SHIELD | Source: Ambulatory Visit | Attending: Internal Medicine | Admitting: Internal Medicine

## 2018-09-16 DIAGNOSIS — Z1231 Encounter for screening mammogram for malignant neoplasm of breast: Secondary | ICD-10-CM | POA: Diagnosis present

## 2018-09-30 ENCOUNTER — Other Ambulatory Visit: Payer: Self-pay | Admitting: Internal Medicine

## 2018-09-30 DIAGNOSIS — N183 Chronic kidney disease, stage 3 unspecified: Secondary | ICD-10-CM

## 2018-09-30 DIAGNOSIS — R748 Abnormal levels of other serum enzymes: Secondary | ICD-10-CM

## 2018-10-09 ENCOUNTER — Ambulatory Visit
Admission: RE | Admit: 2018-10-09 | Discharge: 2018-10-09 | Disposition: A | Discharge status: Home / Self Care | Payer: BLUE CROSS/BLUE SHIELD | Source: Ambulatory Visit | Attending: Internal Medicine | Admitting: Internal Medicine

## 2018-10-09 DIAGNOSIS — R748 Abnormal levels of other serum enzymes: Secondary | ICD-10-CM | POA: Insufficient documentation

## 2018-10-09 DIAGNOSIS — N183 Chronic kidney disease, stage 3 unspecified: Secondary | ICD-10-CM

## 2018-12-14 ENCOUNTER — Encounter: Payer: Self-pay | Admitting: *Deleted

## 2018-12-15 ENCOUNTER — Ambulatory Visit: Payer: BLUE CROSS/BLUE SHIELD | Admitting: Anesthesiology

## 2018-12-15 ENCOUNTER — Ambulatory Visit
Admission: RE | Admit: 2018-12-15 | Discharge: 2018-12-15 | Disposition: A | Discharge status: Home / Self Care | Payer: BLUE CROSS/BLUE SHIELD | Attending: Gastroenterology | Admitting: Gastroenterology

## 2018-12-15 ENCOUNTER — Other Ambulatory Visit: Payer: Self-pay

## 2018-12-15 ENCOUNTER — Encounter
Admission: RE | Disposition: A | Discharge status: Home / Self Care | Payer: Self-pay | Source: Home / Self Care | Attending: Gastroenterology

## 2018-12-15 DIAGNOSIS — Z888 Allergy status to other drugs, medicaments and biological substances status: Secondary | ICD-10-CM | POA: Diagnosis not present

## 2018-12-15 DIAGNOSIS — K219 Gastro-esophageal reflux disease without esophagitis: Secondary | ICD-10-CM | POA: Diagnosis not present

## 2018-12-15 DIAGNOSIS — Z6836 Body mass index (BMI) 36.0-36.9, adult: Secondary | ICD-10-CM | POA: Insufficient documentation

## 2018-12-15 DIAGNOSIS — F419 Anxiety disorder, unspecified: Secondary | ICD-10-CM | POA: Diagnosis not present

## 2018-12-15 DIAGNOSIS — Z91018 Allergy to other foods: Secondary | ICD-10-CM | POA: Diagnosis not present

## 2018-12-15 DIAGNOSIS — Z7982 Long term (current) use of aspirin: Secondary | ICD-10-CM | POA: Insufficient documentation

## 2018-12-15 DIAGNOSIS — Z1211 Encounter for screening for malignant neoplasm of colon: Secondary | ICD-10-CM | POA: Insufficient documentation

## 2018-12-15 DIAGNOSIS — Z86010 Personal history of colonic polyps: Secondary | ICD-10-CM | POA: Insufficient documentation

## 2018-12-15 DIAGNOSIS — I10 Essential (primary) hypertension: Secondary | ICD-10-CM | POA: Diagnosis not present

## 2018-12-15 DIAGNOSIS — E7800 Pure hypercholesterolemia, unspecified: Secondary | ICD-10-CM | POA: Insufficient documentation

## 2018-12-15 DIAGNOSIS — Z91011 Allergy to milk products: Secondary | ICD-10-CM | POA: Insufficient documentation

## 2018-12-15 DIAGNOSIS — K644 Residual hemorrhoidal skin tags: Secondary | ICD-10-CM | POA: Diagnosis not present

## 2018-12-15 DIAGNOSIS — Z79899 Other long term (current) drug therapy: Secondary | ICD-10-CM | POA: Insufficient documentation

## 2018-12-15 DIAGNOSIS — E119 Type 2 diabetes mellitus without complications: Secondary | ICD-10-CM | POA: Diagnosis not present

## 2018-12-15 DIAGNOSIS — E669 Obesity, unspecified: Secondary | ICD-10-CM | POA: Insufficient documentation

## 2018-12-15 HISTORY — PX: COLONOSCOPY WITH PROPOFOL: SHX5780

## 2018-12-15 HISTORY — DX: Complete rotator cuff tear or rupture of unspecified shoulder, not specified as traumatic: M75.120

## 2018-12-15 HISTORY — DX: Asymptomatic varicose veins of bilateral lower extremities: I83.93

## 2018-12-15 LAB — GLUCOSE, CAPILLARY: GLUCOSE-CAPILLARY: 209 mg/dL — AB (ref 70–99)

## 2018-12-15 SURGERY — COLONOSCOPY WITH PROPOFOL
Anesthesia: General

## 2018-12-15 MED ORDER — PROPOFOL 500 MG/50ML IV EMUL
INTRAVENOUS | Status: AC
  Filled 2018-12-15: qty 50

## 2018-12-15 MED ORDER — PROPOFOL 10 MG/ML IV BOLUS
INTRAVENOUS | Status: AC
  Filled 2018-12-15: qty 20

## 2018-12-15 MED ORDER — MIDAZOLAM HCL 2 MG/2ML IJ SOLN
INTRAMUSCULAR | Status: AC
  Filled 2018-12-15: qty 2

## 2018-12-15 MED ORDER — EPHEDRINE SULFATE 50 MG/ML IJ SOLN
INTRAMUSCULAR | Status: DC | PRN
  Administered 2018-12-15 (×2): 10 mg via INTRAVENOUS

## 2018-12-15 MED ORDER — SODIUM CHLORIDE 0.9 % IV SOLN
INTRAVENOUS | Status: DC
  Administered 2018-12-15: 1000 mL via INTRAVENOUS

## 2018-12-15 MED ORDER — MIDAZOLAM HCL 2 MG/2ML IJ SOLN
INTRAMUSCULAR | Status: DC | PRN
  Administered 2018-12-15: 1 mg via INTRAVENOUS

## 2018-12-15 MED ORDER — FENTANYL CITRATE (PF) 100 MCG/2ML IJ SOLN
INTRAMUSCULAR | Status: AC
  Filled 2018-12-15: qty 2

## 2018-12-15 MED ORDER — EPHEDRINE SULFATE 50 MG/ML IJ SOLN
INTRAMUSCULAR | Status: AC
  Filled 2018-12-15: qty 1

## 2018-12-15 MED ORDER — PROPOFOL 10 MG/ML IV BOLUS
INTRAVENOUS | Status: DC | PRN
  Administered 2018-12-15: 40 mg via INTRAVENOUS

## 2018-12-15 MED ORDER — PHENYLEPHRINE HCL 10 MG/ML IJ SOLN
INTRAMUSCULAR | Status: DC | PRN
  Administered 2018-12-15 (×3): 100 ug via INTRAVENOUS

## 2018-12-15 MED ORDER — FENTANYL CITRATE (PF) 100 MCG/2ML IJ SOLN
INTRAMUSCULAR | Status: DC | PRN
  Administered 2018-12-15: 50 ug via INTRAVENOUS

## 2018-12-15 MED ORDER — PROPOFOL 500 MG/50ML IV EMUL
INTRAVENOUS | Status: DC | PRN
  Administered 2018-12-15: 150 ug/kg/min via INTRAVENOUS

## 2018-12-15 NOTE — Anesthesia Preprocedure Evaluation (Signed)
Anesthesia Evaluation  Patient identified by MRN, date of birth, ID band Patient awake    Reviewed: Allergy & Precautions, NPO status , Patient's Chart, lab work & pertinent test results  History of Anesthesia Complications Negative for: history of anesthetic complications  Airway Mallampati: III  TM Distance: >3 FB Neck ROM: Full    Dental  (+) Poor Dentition   Pulmonary neg pulmonary ROS, neg sleep apnea, neg COPD,    breath sounds clear to auscultation- rhonchi (-) wheezing      Cardiovascular hypertension, Pt. on medications (-) CAD, (-) Past MI, (-) Cardiac Stents and (-) CABG  Rhythm:Regular Rate:Normal - Systolic murmurs and - Diastolic murmurs    Neuro/Psych neg Seizures Anxiety negative neurological ROS     GI/Hepatic Neg liver ROS, GERD  ,  Endo/Other  diabetes, Insulin Dependent  Renal/GU negative Renal ROS     Musculoskeletal negative musculoskeletal ROS (+)   Abdominal (+) + obese,   Peds  Hematology negative hematology ROS (+)   Anesthesia Other Findings Past Medical History: No date: Anxiety No date: Complete rotator cuff tear     Comment:  RIGHT No date: Diabetes mellitus without complication (HCC)     Comment:  TYPE 1 No date: GERD (gastroesophageal reflux disease) No date: History of seasonal allergies No date: Hypercholesteremia No date: Hypertension No date: Varicose veins of both lower extremities   Reproductive/Obstetrics                             Anesthesia Physical Anesthesia Plan  ASA: II  Anesthesia Plan: General   Post-op Pain Management:    Induction: Intravenous  PONV Risk Score and Plan: 2 and Propofol infusion  Airway Management Planned: Natural Airway  Additional Equipment:   Intra-op Plan:   Post-operative Plan:   Informed Consent: I have reviewed the patients History and Physical, chart, labs and discussed the procedure including  the risks, benefits and alternatives for the proposed anesthesia with the patient or authorized representative who has indicated his/her understanding and acceptance.     Dental advisory given  Plan Discussed with: CRNA and Anesthesiologist  Anesthesia Plan Comments:         Anesthesia Quick Evaluation

## 2018-12-15 NOTE — H&P (Signed)
Outpatient short stay form Pre-procedure 12/15/2018 7:37 AM Christena Deem MD  Primary Physician: Leotis Shames, MD  Reason for visit: Patient is a 58 year old feet female presenting today for colonoscopy in regards to her personal history of adenomatous colon polyps.  Her last colonoscopy was 11/30/2009 with several polyps being found at that time.  Takes 81 mg aspirin that is been held she takes no other aspirin or blood thinning agent.  She tolerated her prep.  History of present illness: As noted above    Current Facility-Administered Medications:  .  0.9 %  sodium chloride infusion, , Intravenous, Continuous, Christena Deem, MD, Last Rate: 20 mL/hr at 12/15/18 0718, 1,000 mL at 12/15/18 5809  Medications Prior to Admission  Medication Sig Dispense Refill Last Dose  . acetaminophen (TYLENOL) 500 MG tablet Take 1,000 mg by mouth every 6 (six) hours as needed.   Past Week at Unknown time  . aspirin 81 MG tablet Take 81 mg by mouth daily.   Past Week at Unknown time  . hydrochlorothiazide (HYDRODIURIL) 25 MG tablet Take 25 mg by mouth daily.   12/14/2018 at Unknown time  . Insulin Human (INSULIN PUMP) SOLN Inject into the skin. Novolog Insulin Pump   12/14/2018 at Unknown time  . metoprolol succinate (TOPROL-XL) 25 MG 24 hr tablet Take 25 mg by mouth daily.   12/15/2018 at 0600  . ranitidine (ZANTAC) 150 MG tablet Take 150 mg by mouth at bedtime.   Past Week at Unknown time  . simvastatin (ZOCOR) 20 MG tablet Take 20 mg by mouth daily.   12/15/2018 at 0600  . valsartan (DIOVAN) 320 MG tablet Take 320 mg by mouth daily.   12/15/2018 at 0600  . ondansetron (ZOFRAN ODT) 4 MG disintegrating tablet Take 1 tablet (4 mg total) by mouth every 8 (eight) hours as needed for nausea or vomiting. (Patient not taking: Reported on 12/15/2018) 20 tablet 1 Completed Course at Unknown time  . oxyCODONE (ROXICODONE) 5 MG immediate release tablet Take 1-2 tablets (5-10 mg total) by mouth every 4 (four) hours as  needed for severe pain. (Patient not taking: Reported on 12/15/2018) 60 tablet 0 Completed Course at Unknown time     Allergies  Allergen Reactions  . Adhesive [Tape] Itching  . Lovastatin Other (See Comments)    "joint pain"  . Malt Cough  . Metformin And Related Diarrhea  . Milk-Related Compounds Other (See Comments)  . Vytorin [Ezetimibe-Simvastatin] Other (See Comments)    "joint pain"  . Wheat Bran Cough    "rash"     Past Medical History:  Diagnosis Date  . Anxiety   . Complete rotator cuff tear    RIGHT  . Diabetes mellitus without complication (HCC)    TYPE 1  . GERD (gastroesophageal reflux disease)   . History of seasonal allergies   . Hypercholesteremia   . Hypertension   . Varicose veins of both lower extremities     Review of systems:      Physical Exam    Heart and lungs: Him without rub or gallop, lungs are bilaterally clear.    HEENT: Normocephalic atraumatic eyes are anicteric    Other:    Pertinant exam for procedure: Soft nontender nondistended bowel sounds positive normoactive.    Planned proceedures: Colonoscopy and indicated procedures. I have discussed the risks benefits and complications of procedures to include not limited to bleeding, infection, perforation and the risk of sedation and the patient wishes to proceed.  Christena Deem, MD Gastroenterology 12/15/2018  7:37 AM

## 2018-12-15 NOTE — Anesthesia Post-op Follow-up Note (Signed)
Anesthesia QCDR form completed.        

## 2018-12-15 NOTE — Anesthesia Postprocedure Evaluation (Signed)
Anesthesia Post Note  Patient: Tiffany Ellis  Procedure(s) Performed: COLONOSCOPY WITH PROPOFOL (N/A )  Patient location during evaluation: Endoscopy Anesthesia Type: General Level of consciousness: awake and alert and oriented Pain management: pain level controlled Vital Signs Assessment: post-procedure vital signs reviewed and stable Respiratory status: spontaneous breathing, nonlabored ventilation and respiratory function stable Cardiovascular status: blood pressure returned to baseline and stable Postop Assessment: no signs of nausea or vomiting Anesthetic complications: no     Last Vitals:  Vitals:   12/15/18 0838 12/15/18 0848  BP: (!) 132/47 123/62  Pulse:  87  Resp:    Temp:    SpO2:  100%    Last Pain:  Vitals:   12/15/18 0848  TempSrc:   PainSc: 0-No pain                 Daking Westervelt

## 2018-12-15 NOTE — Transfer of Care (Signed)
Immediate Anesthesia Transfer of Care Note  Patient: Tiffany Ellis  Procedure(s) Performed: COLONOSCOPY WITH PROPOFOL (N/A )  Patient Location: PACU  Anesthesia Type:General  Level of Consciousness: awake, alert  and oriented  Airway & Oxygen Therapy: Patient Spontanous Breathing and Patient connected to nasal cannula oxygen  Post-op Assessment: Report given to RN and Post -op Vital signs reviewed and stable  Post vital signs: Reviewed and stable  Last Vitals:  Vitals Value Taken Time  BP 116/62 12/15/2018  8:22 AM  Temp 36.1 C 12/15/2018  8:18 AM  Pulse 95 12/15/2018  8:23 AM  Resp 20 12/15/2018  8:23 AM  SpO2 97 % 12/15/2018  8:23 AM  Vitals shown include unvalidated device data.  Last Pain:  Vitals:   12/15/18 0818  TempSrc: Tympanic  PainSc: 0-No pain         Complications: No apparent anesthesia complications

## 2018-12-16 ENCOUNTER — Encounter: Payer: Self-pay | Admitting: Gastroenterology

## 2018-12-16 NOTE — Op Note (Signed)
[facility] Gastroenterology Patient Name: Tiffany Ellis Procedure Date: 12/15/2018 7:08 AM MRN: [id number] Account #: [id number] Date of Birth: 1960/11/18 Admit Type: Outpatient Age: 58 Room: [facility] ENDO ROOM 3 Gender: Female Note Status: Finalized Procedure:            Colonoscopy Indications:          Personal history of colonic polyps Providers:            Christena Deem, MD Referring MD:         Leotis Shames (Referring MD) Medicines:            Monitored Anesthesia Care Complications:        No immediate complications. Procedure:            Pre-Anesthesia Assessment:                       - ASA Grade Assessment: II - A patient with mild                        systemic disease.                       After obtaining informed consent, the colonoscope was                        passed under direct vision. Throughout the procedure,                        the patient's blood pressure, pulse, and oxygen                        saturations were monitored continuously. The                        Colonoscope was introduced through the anus and                        advanced to the the cecum, identified by appendiceal                        orifice and ileocecal valve. The patient tolerated the                        procedure well. The quality of the bowel preparation                        was good. Findings:      The colon (entire examined portion) appeared normal.      Non-bleeding external hemorrhoids were found during perianal exam. The       hemorrhoids were small.      The retroflexed view of the distal rectum and anal verge was normal and       showed no anal or rectal abnormalities, note small normal internal anal       pillars.      The digital rectal exam was normal otherwise. Impression:           - The entire examined colon is normal.                       - Non-bleeding external hemorrhoids.                       -  The distal rectum and anal verge are  normal on                        retroflexion view.                       - No specimens collected. Recommendation:       - Discharge patient to home.                       - Advance diet as tolerated.                       - Repeat colonoscopy in 5 years for screening purposes. Procedure Code(s):    --- Professional ---                       [phone], Colonoscopy, flexible; diagnostic, including                        collection of specimen(s) by brushing or washing, when                        performed (separate procedure) Diagnosis Code(s):    --- Professional ---                       K64.4, Residual hemorrhoidal skin tags                       Z86.010, Personal history of colonic polyps CPT copyright 2018 American Medical Association. All rights reserved. The codes documented in this report are preliminary and upon coder review may  be revised to meet current compliance requirements. Christena Deem, MD 12/15/2018 8:18:38 AM This report has been signed electronically. Number of Addenda: 0 Note Initiated On: 12/15/2018 7:08 AM Scope Withdrawal Time: 0 hours 10 minutes 34 seconds  Total Procedure Duration: 0 hours 23 minutes 9 seconds       [facility]

## 2019-08-27 ENCOUNTER — Other Ambulatory Visit: Payer: Self-pay | Admitting: Physician Assistant

## 2019-08-27 DIAGNOSIS — Z1231 Encounter for screening mammogram for malignant neoplasm of breast: Secondary | ICD-10-CM

## 2019-09-20 ENCOUNTER — Other Ambulatory Visit: Payer: Self-pay

## 2019-09-20 DIAGNOSIS — Z20822 Contact with and (suspected) exposure to covid-19: Secondary | ICD-10-CM

## 2019-09-22 LAB — NOVEL CORONAVIRUS, NAA: SARS-CoV-2, NAA: NOT DETECTED

## 2019-10-06 ENCOUNTER — Ambulatory Visit
Admission: RE | Admit: 2019-10-06 | Discharge: 2019-10-06 | Disposition: A | Discharge status: Home / Self Care | Payer: BC Managed Care – PPO | Source: Ambulatory Visit | Attending: Physician Assistant | Admitting: Physician Assistant

## 2019-10-06 DIAGNOSIS — Z1231 Encounter for screening mammogram for malignant neoplasm of breast: Secondary | ICD-10-CM | POA: Diagnosis not present

## 2020-09-18 ENCOUNTER — Other Ambulatory Visit: Payer: Self-pay | Admitting: Physician Assistant

## 2020-09-18 DIAGNOSIS — Z1231 Encounter for screening mammogram for malignant neoplasm of breast: Secondary | ICD-10-CM

## 2020-10-09 ENCOUNTER — Other Ambulatory Visit: Payer: Self-pay

## 2020-10-09 ENCOUNTER — Ambulatory Visit
Admission: RE | Admit: 2020-10-09 | Discharge: 2020-10-09 | Disposition: A | Discharge status: Home / Self Care | Payer: BC Managed Care – PPO | Source: Ambulatory Visit | Attending: Physician Assistant | Admitting: Physician Assistant

## 2020-10-09 DIAGNOSIS — Z1231 Encounter for screening mammogram for malignant neoplasm of breast: Secondary | ICD-10-CM

## 2021-08-20 ENCOUNTER — Other Ambulatory Visit: Payer: Self-pay | Admitting: Internal Medicine

## 2021-08-20 DIAGNOSIS — R748 Abnormal levels of other serum enzymes: Secondary | ICD-10-CM

## 2021-08-29 ENCOUNTER — Other Ambulatory Visit: Payer: Self-pay

## 2021-08-29 ENCOUNTER — Ambulatory Visit
Admission: RE | Admit: 2021-08-29 | Discharge: 2021-08-29 | Disposition: A | Discharge status: Home / Self Care | Payer: BC Managed Care – PPO | Source: Ambulatory Visit | Attending: Internal Medicine | Admitting: Internal Medicine

## 2021-08-29 DIAGNOSIS — R748 Abnormal levels of other serum enzymes: Secondary | ICD-10-CM | POA: Diagnosis not present

## 2022-05-10 IMAGING — MG DIGITAL SCREENING BILAT W/ TOMO W/ CAD
6 of 12 series · 6 of 36 positions shown · non-contrast
Comparison: Previous exam(s).

ACR Breast Density Category a: The breast tissue is almost entirely
fatty.

CLINICAL DATA: Screening.

EXAM:
DIGITAL SCREENING BILATERAL MAMMOGRAM WITH TOMO AND CAD

[L MLO synth-2D (1 of 2)]
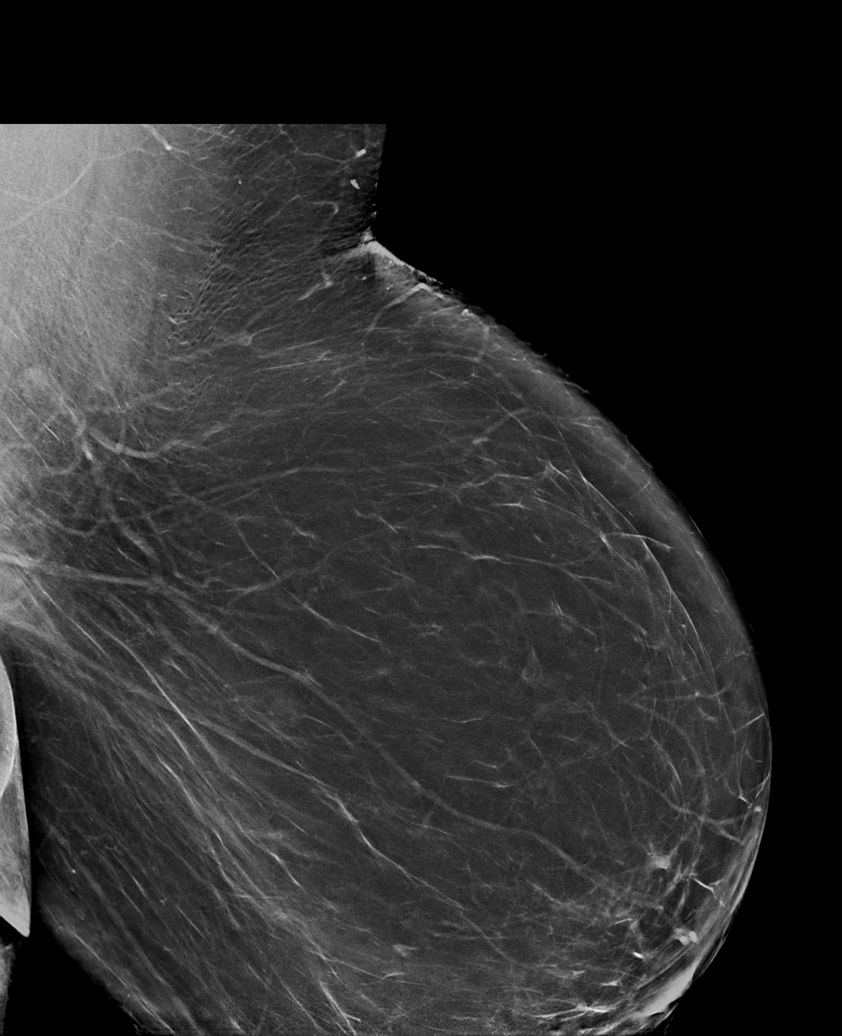

[R MLO synth-2D (1 of 2)]
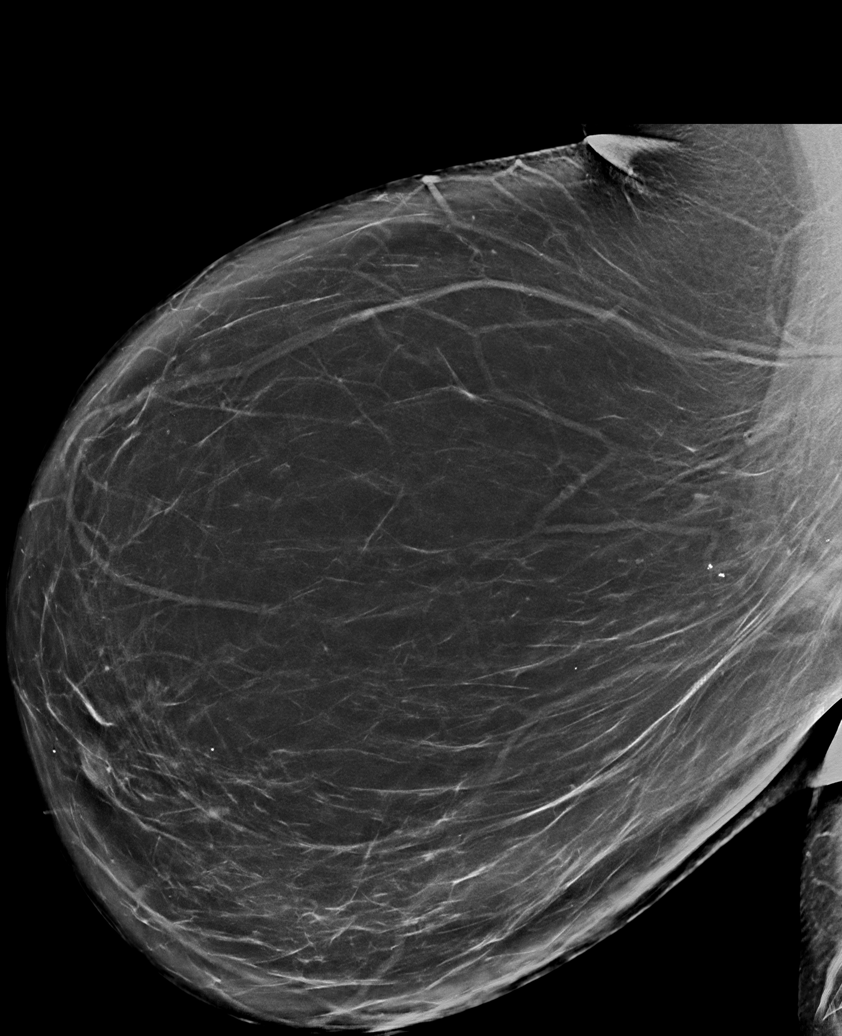

[R CC synth-2D]
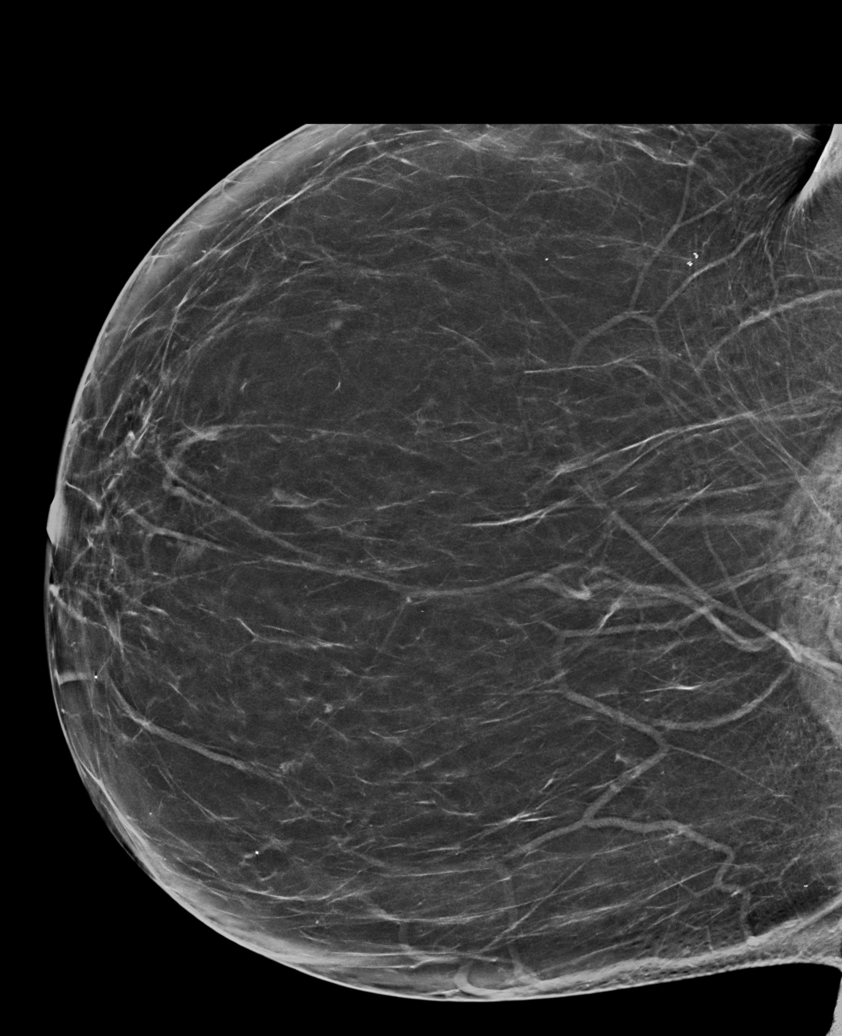

[L MLO synth-2D (2 of 2)]
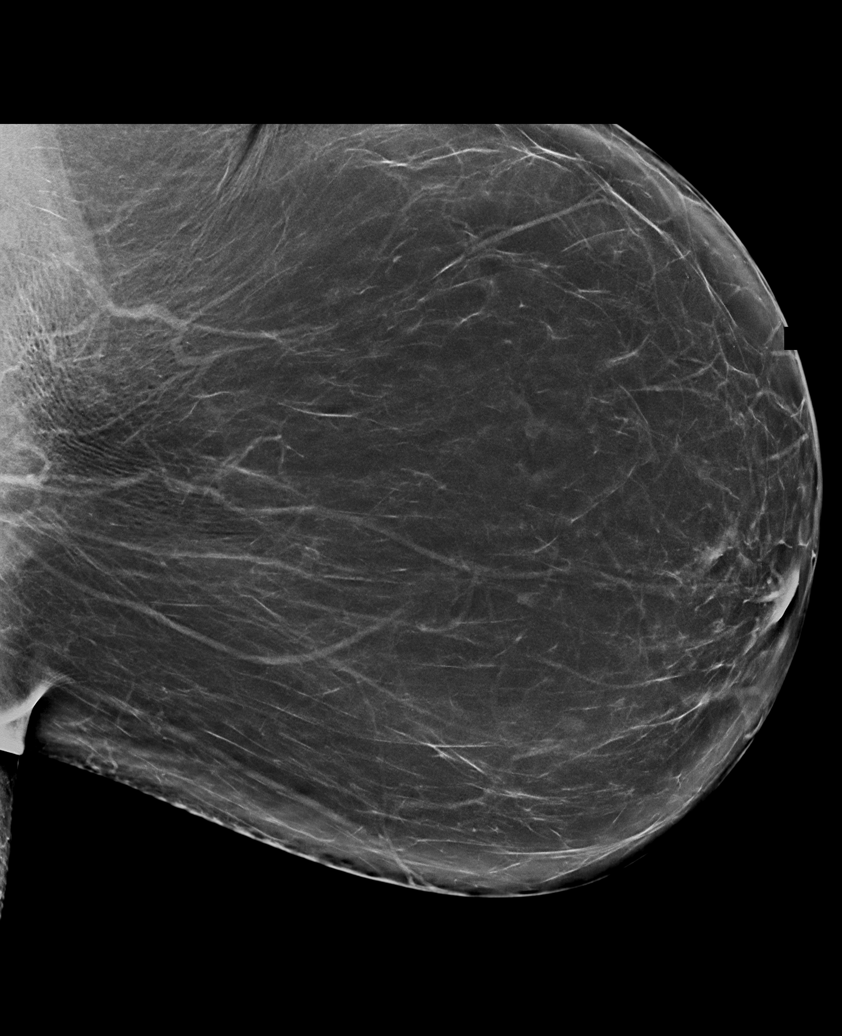

[L CC synth-2D]
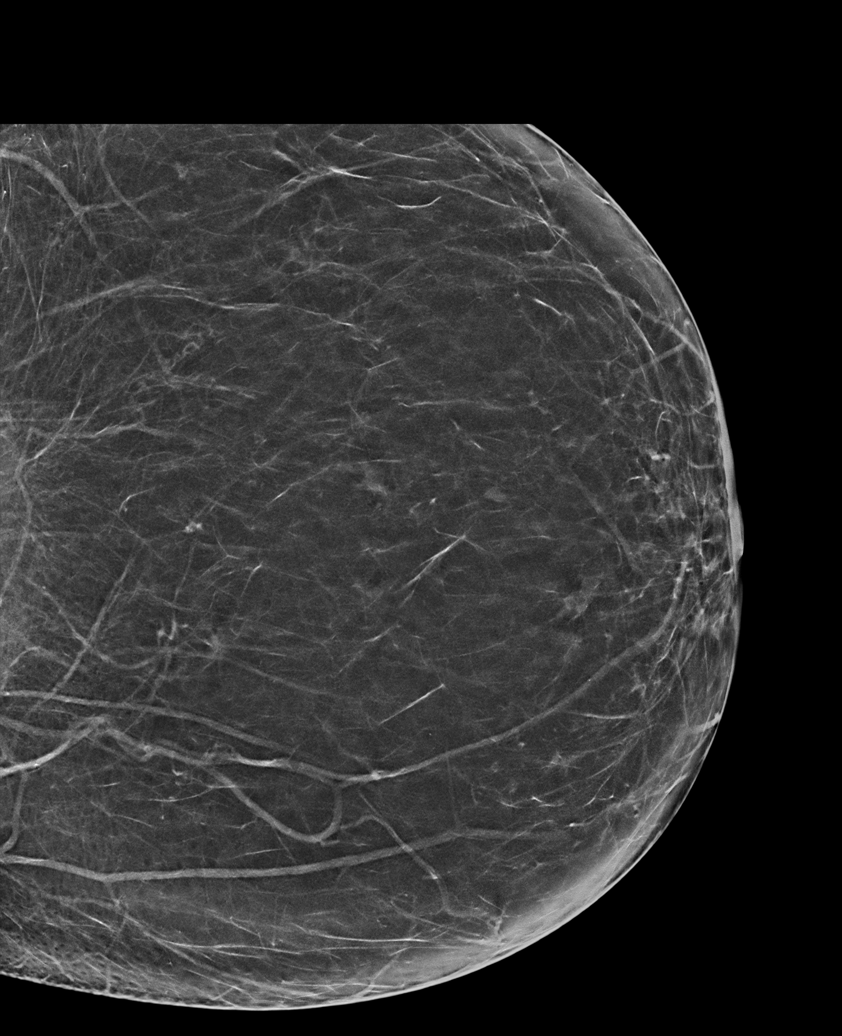

[R MLO synth-2D (2 of 2)]
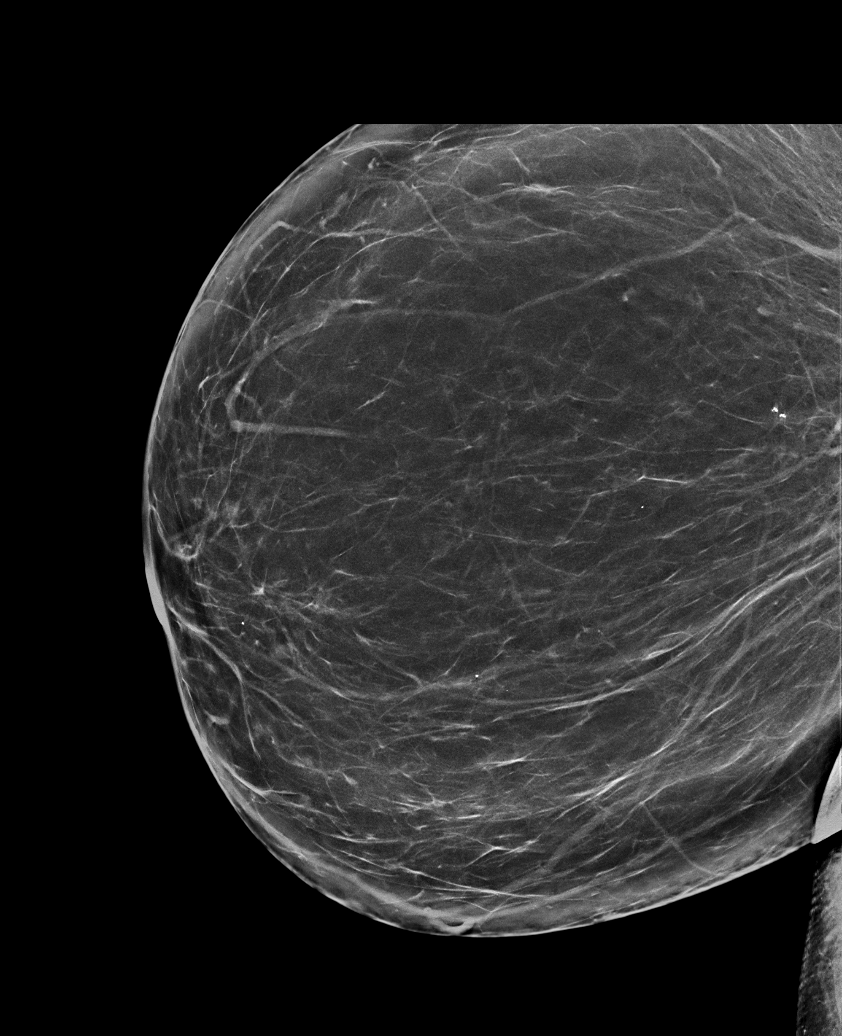

[6 of 36 positions shown; findings below may reference images not displayed]

FINDINGS: There are no findings suspicious for malignancy. Images were
processed with CAD.
IMPRESSION: No mammographic evidence of malignancy. A result letter of this
screening mammogram will be mailed directly to the patient.

RECOMMENDATION:
Screening mammogram in one year. (Code:[id number]-[id number])

BI-RADS CATEGORY  1: Negative.

## 2022-05-15 ENCOUNTER — Other Ambulatory Visit: Payer: Self-pay | Admitting: Physician Assistant

## 2022-05-15 DIAGNOSIS — Z1231 Encounter for screening mammogram for malignant neoplasm of breast: Secondary | ICD-10-CM

## 2022-06-05 ENCOUNTER — Ambulatory Visit
Admission: RE | Admit: 2022-06-05 | Discharge: 2022-06-05 | Disposition: A | Discharge status: Home / Self Care | Payer: BC Managed Care – PPO | Source: Ambulatory Visit | Attending: Physician Assistant | Admitting: Physician Assistant

## 2022-06-05 DIAGNOSIS — Z1231 Encounter for screening mammogram for malignant neoplasm of breast: Secondary | ICD-10-CM | POA: Insufficient documentation

## 2023-03-30 IMAGING — US US ABDOMEN LIMITED
1 series · 15 of 25 positions shown · non-contrast
Comparison: 08/18/2018

CLINICAL DATA: Elevated liver enzymes

EXAM:
ULTRASOUND ABDOMEN LIMITED RIGHT UPPER QUADRANT

[Series 1: us abdomen limited ruq · 15 of 40 slices shown]
[im 1/40]
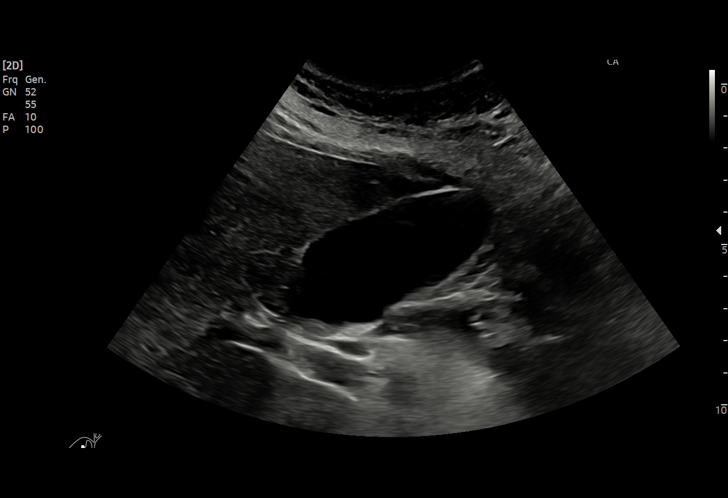
[im 4/40]
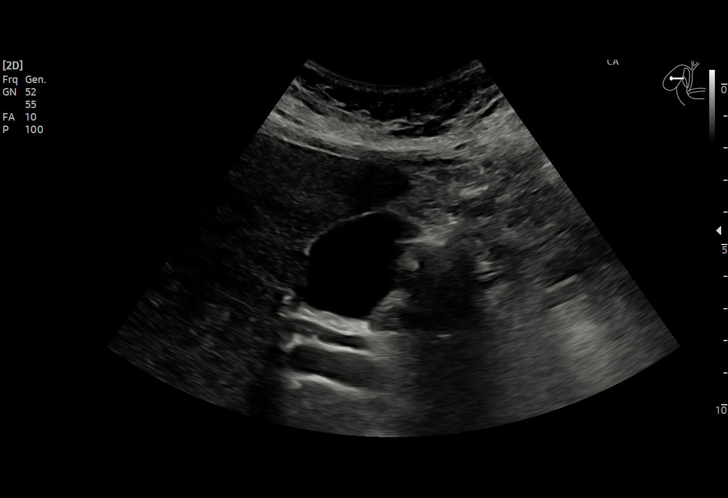
[im 7/40]
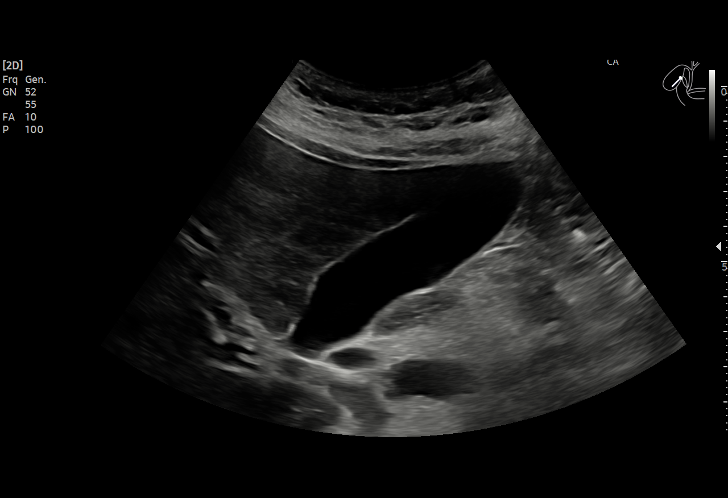
[im 9/40]
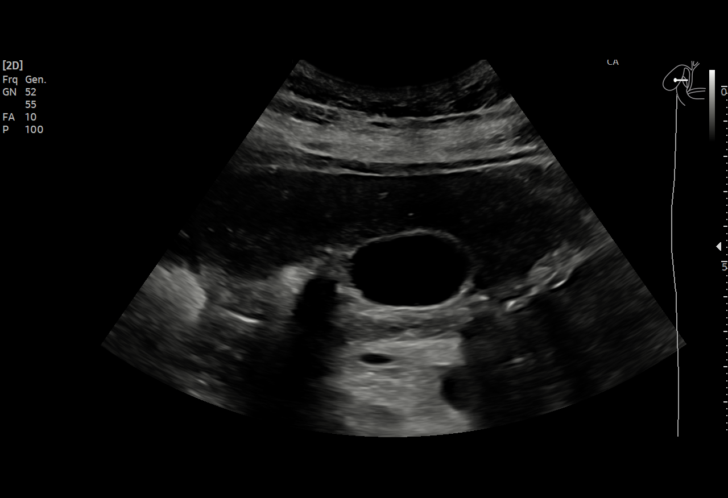
[im 12/40]
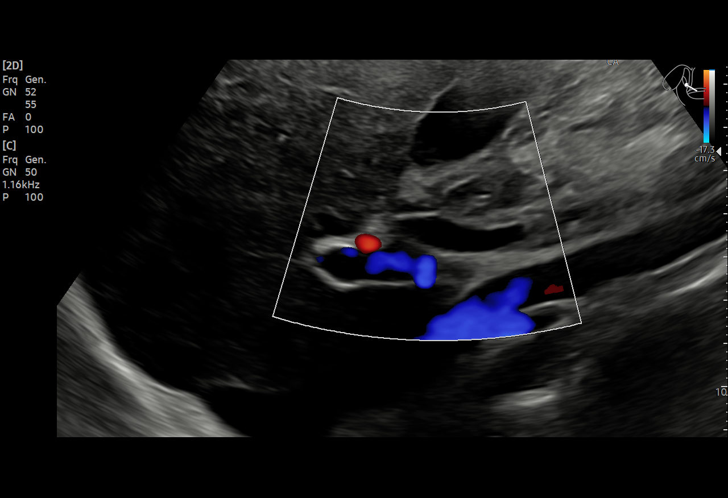
[im 15/40]
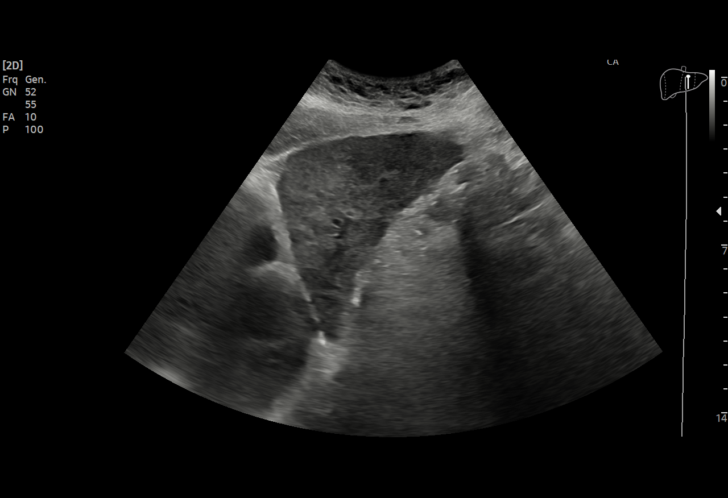
[im 17/40]
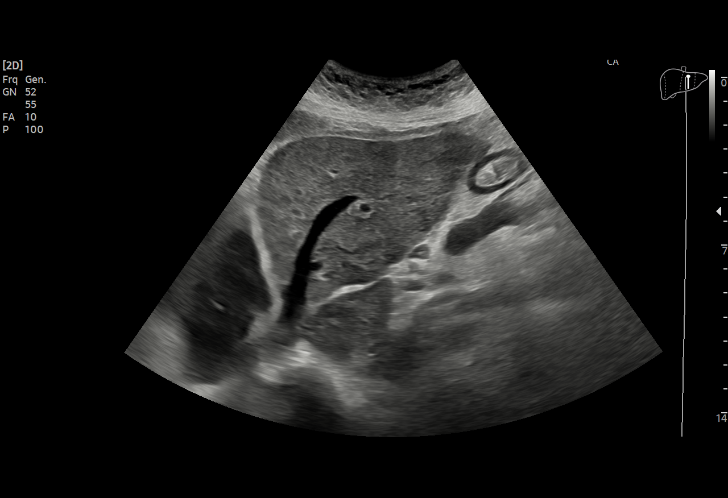
[im 20/40]
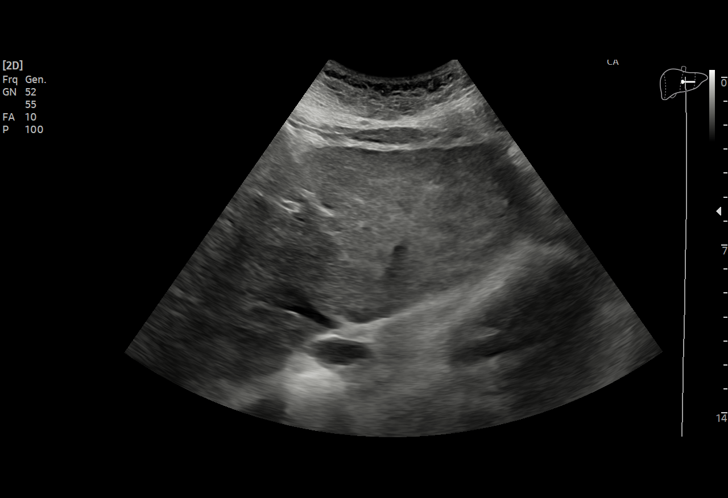
[im 23/40]
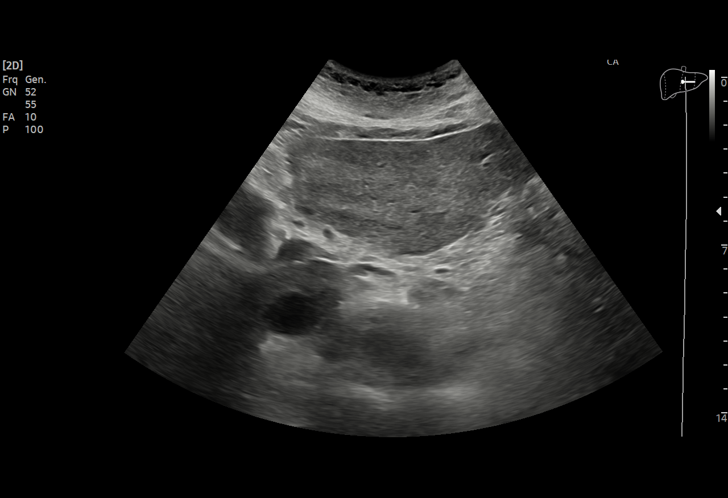
[im 25/40]
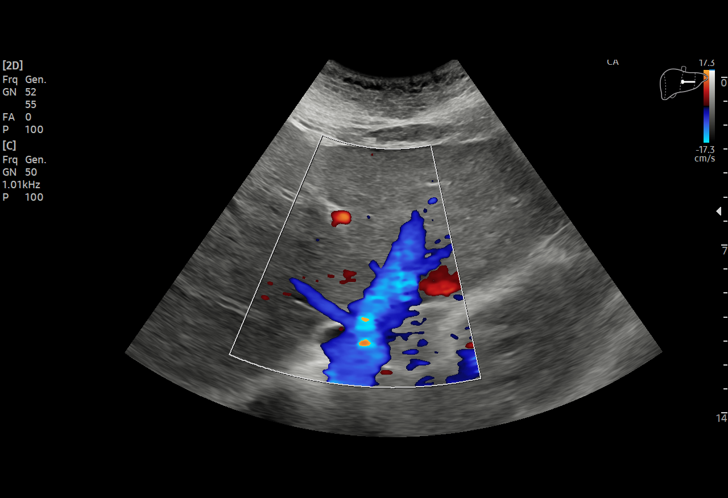
[im 28/40]
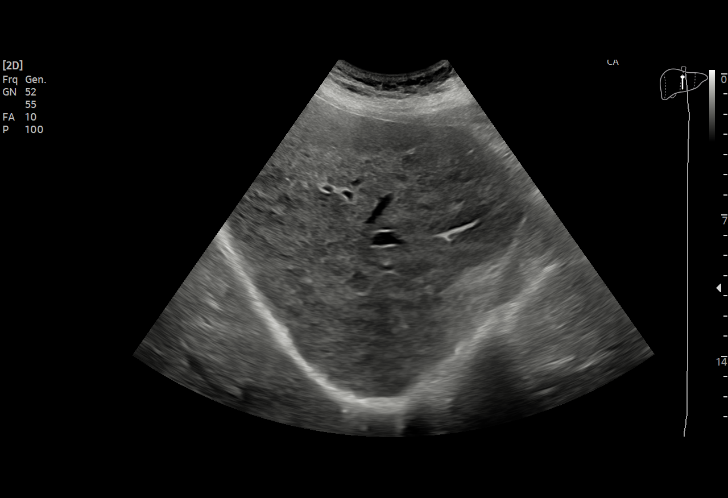
[im 31/40]
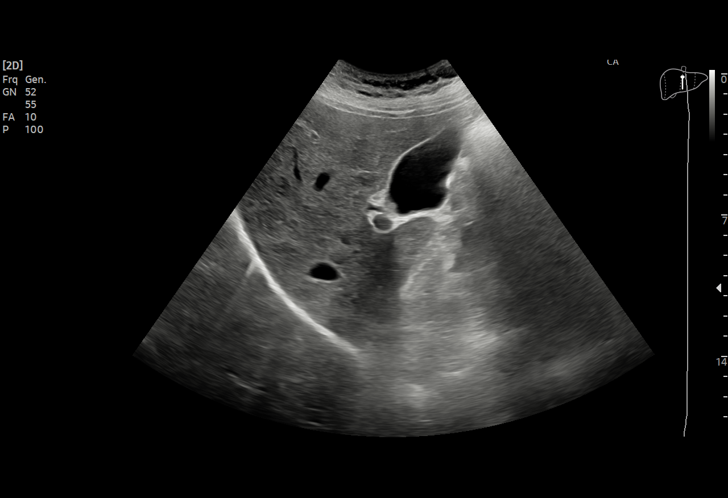
[im 33/40]
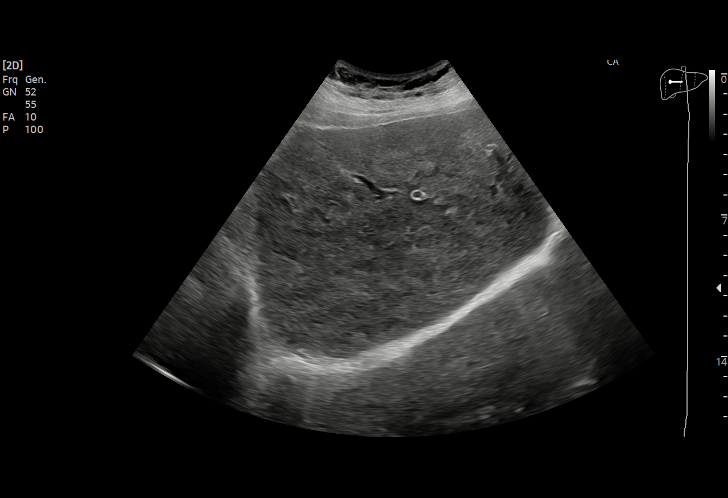
[im 36/40]
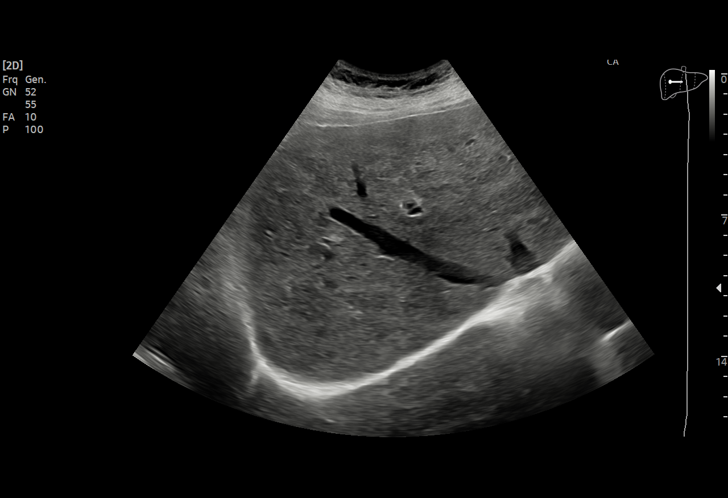
[im 40/40]
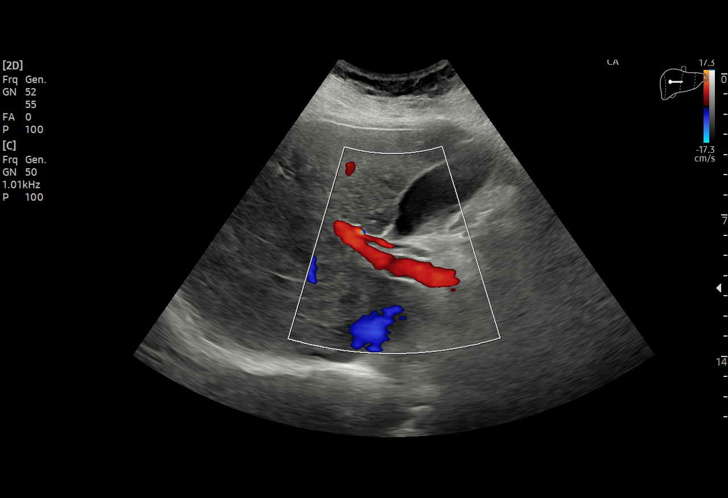

[15 of 25 positions shown; findings below may reference images not displayed]

FINDINGS: Gallbladder:

No gallstones or wall thickening visualized. No sonographic Murphy
sign noted by sonographer.

Common bile duct:

Diameter: 7 mm in mid diameter, appropriately tapering to 4 mm
within the pancreatic head.

Liver:

Since the prior examination, the hepatic contour has become subtly
nodular and the hepatic echotexture has diffusely coarsened
suggesting changes of cirrhosis. Liver size is within normal limits.
No focal intrahepatic masses are identified. There is no
intrahepatic biliary ductal dilation. Portal vein is patent on color
Doppler imaging with normal direction of blood flow towards the
liver.

Other: No ascites
IMPRESSION: Parenchymal changes suggesting interval development of cirrhosis.
Hepatic elastography or tissue sampling may be helpful for further
evaluation.

## 2023-09-19 ENCOUNTER — Other Ambulatory Visit: Payer: Self-pay | Admitting: Physician Assistant

## 2023-09-19 DIAGNOSIS — Z1231 Encounter for screening mammogram for malignant neoplasm of breast: Secondary | ICD-10-CM

## 2023-09-23 ENCOUNTER — Ambulatory Visit
Admission: RE | Admit: 2023-09-23 | Discharge: 2023-09-23 | Disposition: A | Discharge status: Home / Self Care | Payer: BC Managed Care – PPO | Source: Ambulatory Visit | Attending: Physician Assistant | Admitting: Physician Assistant

## 2023-09-23 DIAGNOSIS — Z1231 Encounter for screening mammogram for malignant neoplasm of breast: Secondary | ICD-10-CM | POA: Diagnosis present

## 2024-06-17 ENCOUNTER — Other Ambulatory Visit: Payer: Self-pay | Admitting: Orthopedic Surgery

## 2024-07-02 ENCOUNTER — Other Ambulatory Visit: Payer: Self-pay

## 2024-07-02 ENCOUNTER — Encounter
Admission: RE | Admit: 2024-07-02 | Discharge: 2024-07-02 | Disposition: A | Discharge status: Home / Self Care | Source: Ambulatory Visit | Attending: Orthopedic Surgery | Admitting: Orthopedic Surgery

## 2024-07-02 VITALS — BP 182/74 | HR 70 | Temp 97.5°F | Resp 18 | Ht 67.0 in | Wt 238.0 lb

## 2024-07-02 DIAGNOSIS — Z01810 Encounter for preprocedural cardiovascular examination: Secondary | ICD-10-CM | POA: Diagnosis not present

## 2024-07-02 DIAGNOSIS — Z01818 Encounter for other preprocedural examination: Secondary | ICD-10-CM | POA: Insufficient documentation

## 2024-07-02 DIAGNOSIS — R001 Bradycardia, unspecified: Secondary | ICD-10-CM | POA: Insufficient documentation

## 2024-07-02 DIAGNOSIS — Z01812 Encounter for preprocedural laboratory examination: Secondary | ICD-10-CM

## 2024-07-02 HISTORY — DX: Nausea with vomiting, unspecified: R11.2

## 2024-07-02 HISTORY — DX: Nausea with vomiting, unspecified: Z98.890

## 2024-07-02 LAB — CBC WITH DIFFERENTIAL/PLATELET
Abs Immature Granulocytes: 0.07 K/uL (ref 0.00–0.07)
Basophils Absolute: 0.1 K/uL (ref 0.0–0.1)
Basophils Relative: 1 %
Eosinophils Absolute: 0.3 K/uL (ref 0.0–0.5)
Eosinophils Relative: 3 %
HCT: 47.1 % — ABNORMAL HIGH (ref 36.0–46.0)
Hemoglobin: 15.2 g/dL — ABNORMAL HIGH (ref 12.0–15.0)
Immature Granulocytes: 1 %
Lymphocytes Relative: 18 %
Lymphs Abs: 1.6 K/uL (ref 0.7–4.0)
MCH: 29.6 pg (ref 26.0–34.0)
MCHC: 32.3 g/dL (ref 30.0–36.0)
MCV: 91.6 fL (ref 80.0–100.0)
Monocytes Absolute: 0.7 K/uL (ref 0.1–1.0)
Monocytes Relative: 9 %
Neutro Abs: 5.9 K/uL (ref 1.7–7.7)
Neutrophils Relative %: 68 %
Platelets: 226 K/uL (ref 150–400)
RBC: 5.14 MIL/uL — ABNORMAL HIGH (ref 3.87–5.11)
RDW: 12.9 % (ref 11.5–15.5)
WBC: 8.6 K/uL (ref 4.0–10.5)
nRBC: 0 % (ref 0.0–0.2)

## 2024-07-02 LAB — COMPREHENSIVE METABOLIC PANEL WITH GFR
ALT: 15 U/L (ref 0–44)
AST: 23 U/L (ref 15–41)
Albumin: 3.8 g/dL (ref 3.5–5.0)
Alkaline Phosphatase: 133 U/L — ABNORMAL HIGH (ref 38–126)
Anion gap: 10 (ref 5–15)
BUN: 24 mg/dL — ABNORMAL HIGH (ref 8–23)
CO2: 24 mmol/L (ref 22–32)
Calcium: 9.1 mg/dL (ref 8.9–10.3)
Chloride: 105 mmol/L (ref 98–111)
Creatinine, Ser: 1.2 mg/dL — ABNORMAL HIGH (ref 0.44–1.00)
GFR, Estimated: 50 mL/min — ABNORMAL LOW (ref >60)
Glucose, Bld: 155 mg/dL — ABNORMAL HIGH (ref 70–99)
Potassium: 3.6 mmol/L (ref 3.5–5.1)
Sodium: 139 mmol/L (ref 135–145)
Total Bilirubin: 1.1 mg/dL (ref 0.0–1.2)
Total Protein: 7.5 g/dL (ref 6.5–8.1)

## 2024-07-02 LAB — URINALYSIS, ROUTINE W REFLEX MICROSCOPIC
Bacteria, UA: NONE SEEN
Bilirubin Urine: NEGATIVE
Glucose, UA: >=500 mg/dL — AB
Hgb urine dipstick: NEGATIVE
Ketones, ur: NEGATIVE mg/dL
Leukocytes,Ua: NEGATIVE
Nitrite: NEGATIVE
Protein, ur: NEGATIVE mg/dL
Specific Gravity, Urine: 1.008 (ref 1.005–1.030)
pH: 5 (ref 5.0–8.0)

## 2024-07-02 LAB — SURGICAL PCR SCREEN
MRSA, PCR: NEGATIVE
Staphylococcus aureus: POSITIVE — AB

## 2024-07-02 NOTE — Patient Instructions (Addendum)
 Your procedure is scheduled on:  THURSDAY OCTOBER 2  Report to the Registration Desk on the 1st floor of the [facility] To find out your arrival time, please call [phone] between 1PM - 3PM on:   [facility]  OCTOBER 1  If your arrival time is 6:00 am, do not arrive before that time as the [facility] entrance doors do not open until 6:00 am.  REMEMBER: Instructions that are not followed completely may result in serious medical risk, up to and including death; or upon the discretion of your surgeon and anesthesiologist your surgery may need to be rescheduled.  Do not eat food after midnight the night before surgery.  No gum chewing or hard candies.  You may however, drink WATER up to 3 hours before you are scheduled to arrive for your surgery. Do not drink anything within 3 hours of your scheduled arrival time.  In addition, your doctor has ordered for you to drink the provided:  Gatorade G2 Drinking this carbohydrate drink up to three hours before surgery helps to reduce insulin  resistance and improve patient outcomes. Please complete drinking 3 hours before scheduled arrival time.  One week prior to surgery: STARTING THURSDAY SEPTEMBER 25  Stop Anti-inflammatories (NSAIDS) such as Advil, Aleve, Ibuprofen, Motrin, Naproxen, Naprosyn and Aspirin based products such as Excedrin, Goody's Powder, BC Powder. Stop ANY OVER THE COUNTER supplements until after surgery. fluticasone (FLONASE)   You may however, continue to take Tylenol  if needed for pain up until the day of surgery.  **Follow guidelines for insulin  and diabetes medications.** Insulin  Human (INSULIN  PUMP) and insulin  aspart (NOVOLOG )  you are to contact Dr. Damian [phone]  and ask for instructions regarding your insulin  pump settings for a 2 hour surgery.   FARXIGA hold 3 days prior to surgery. Last dose MONDAY SEPTEMBER 29   Continue taking all of your other prescription medications up until the day of  surgery.  ON THE DAY OF SURGERY ONLY TAKE THESE MEDICATIONS WITH SIPS OF WATER:  metoprolol succinate (TOPROL-XL)  omeprazole (PRILOSEC)   No Alcohol for 24 hours before or after surgery.  Do not use any recreational drugs for at least a week (preferably 2 weeks) before your surgery.  Please be advised that the combination of cocaine and anesthesia may have negative outcomes, up to and including death. If you test positive for cocaine, your surgery will be cancelled.  On the morning of surgery brush your teeth with toothpaste and water, you may rinse your mouth with mouthwash if you wish. Do not swallow any toothpaste or mouthwash.  Use CHG Soap as directed on instruction sheet.  Do not wear jewelry, make-up, hairpins, clips or nail polish.  For welded (permanent) jewelry: bracelets, anklets, waist bands, etc.  Please have this removed prior to surgery.  If it is not removed, there is a chance that hospital personnel will need to cut it off on the day of surgery.  Do not wear lotions, powders, or perfumes.   Do not shave body hair from the neck down 48 hours before surgery.  Contact lenses, hearing aids and dentures may not be worn into surgery.  Do not bring valuables to the hospital. [facility] is not responsible for any missing/lost belongings or valuables.   Notify your doctor if there is any change in your medical condition (cold, fever, infection).  Wear comfortable clothing (specific to your surgery type) to the hospital.  After surgery, you can help prevent lung complications by doing  breathing exercises.  Take deep breaths and cough every 1-2 hours. Your doctor may order a device called an Incentive Spirometer to help you take deep breaths.  If you are being admitted to the hospital overnight, leave your suitcase in the car. After surgery it may be brought to your room.  In case of increased patient census, it may be necessary for you, the patient, to continue your  postoperative care in the Same Day Surgery department.  If you are being discharged the day of surgery, you will not be allowed to drive home. You will need a responsible individual to drive you home and stay with you for 24 hours after surgery.   If you are taking public transportation, you will need to have a responsible individual with you.  Please call the Pre-admissions Testing Dept. at [phone] if you have any questions about these instructions.  Surgery Visitation Policy:  Patients having surgery or a procedure may have two visitors.  Children under the age of 22 must have an adult with them who is not the patient.  Inpatient Visitation:    Visiting hours are 7 a.m. to 8 p.m. Up to four visitors are allowed at one time in a patient room. The visitors may rotate out with other people during the day.  One visitor age 85 or older may stay with the patient overnight and must be in the room by 8 p.m.   Merchandiser, retail to address health-related social needs:  [email or web address]      Pre-operative 5 CHG Bath Instructions   You can play a key role in reducing the risk of infection after surgery. Your skin needs to be as free of germs as possible. You can reduce the number of germs on your skin by washing with CHG (chlorhexidine gluconate) soap before surgery. CHG is an antiseptic soap that kills germs and continues to kill germs even after washing.   DO NOT use if you have an allergy to chlorhexidine/CHG or antibacterial soaps. If your skin becomes reddened or irritated, stop using the CHG and notify one of our RNs at [phone].   Please shower with the CHG soap starting 4 days before surgery using the following schedule:  STARTING SUNDAY SEPTEMBER 28     Please keep in mind the following:  DO NOT shave, including legs and underarms, starting the day of your first shower.   You may shave your face at any point before/day of surgery.  Place  clean sheets on your bed the day you start using CHG soap. Use a clean washcloth (not used since being washed) for each shower. DO NOT sleep with pets once you start using the CHG.   CHG Shower Instructions:  If you choose to wash your hair and private area, wash first with your normal shampoo/soap.  After you use shampoo/soap, rinse your hair and body thoroughly to remove shampoo/soap residue.  Turn the water OFF and apply about 3 tablespoons (45 ml) of CHG soap to a CLEAN washcloth.  Apply CHG soap ONLY FROM YOUR NECK DOWN TO YOUR TOES (washing for 3-5 minutes)  DO NOT use CHG soap on face, private areas, open wounds, or sores.  Pay special attention to the area where your surgery is being performed.  If you are having back surgery, having someone wash your back for you may be helpful. Wait 2 minutes after CHG soap is applied, then you may rinse off the CHG soap.  Pat dry with a clean towel  Put on clean clothes/pajamas   If you choose to wear lotion, please use ONLY the CHG-compatible lotions on the back of this paper.     Additional instructions for the day of surgery: DO NOT APPLY any lotions, deodorants, cologne, or perfumes.   Put on clean/comfortable clothes.  Brush your teeth.  Ask your nurse before applying any prescription medications to the skin.      CHG Compatible Lotions   Aveeno Moisturizing lotion  Cetaphil Moisturizing Cream  Cetaphil Moisturizing Lotion  Clairol Herbal Essence Moisturizing Lotion, Dry Skin  Clairol Herbal Essence Moisturizing Lotion, Extra Dry Skin  Clairol Herbal Essence Moisturizing Lotion, Normal Skin  Curel Age Defying Therapeutic Moisturizing Lotion with Alpha Hydroxy  Curel Extreme Care Body Lotion  Curel Soothing Hands Moisturizing Hand Lotion  Curel Therapeutic Moisturizing Cream, Fragrance-Free  Curel Therapeutic Moisturizing Lotion, Fragrance-Free  Curel Therapeutic Moisturizing Lotion, Original Formula  Eucerin Daily Replenishing  Lotion  Eucerin Dry Skin Therapy Plus Alpha Hydroxy Crme  Eucerin Dry Skin Therapy Plus Alpha Hydroxy Lotion  Eucerin Original Crme  Eucerin Original Lotion  Eucerin Plus Crme Eucerin Plus Lotion  Eucerin TriLipid Replenishing Lotion  Keri Anti-Bacterial Hand Lotion  Keri Deep Conditioning Original Lotion Dry Skin Formula Softly Scented  Keri Deep Conditioning Original Lotion, Fragrance Free Sensitive Skin Formula  Keri Lotion Fast Absorbing Fragrance Free Sensitive Skin Formula  Keri Lotion Fast Absorbing Softly Scented Dry Skin Formula  Keri Original Lotion  Keri Skin Renewal Lotion Keri Silky Smooth Lotion  Keri Silky Smooth Sensitive Skin Lotion  Nivea Body Creamy Conditioning Oil  Nivea Body Extra Enriched Lotion  Nivea Body Original Lotion  Nivea Body Sheer Moisturizing Lotion Nivea Crme  Nivea Skin Firming Lotion  NutraDerm 30 Skin Lotion  NutraDerm Skin Lotion  NutraDerm Therapeutic Skin Cream  NutraDerm Therapeutic Skin Lotion  ProShield Protective Hand Cream  Provon moisturizing lotion    How to Use an Incentive Spirometer  An incentive spirometer is a tool that measures how well you are filling your lungs with each breath. Learning to take long, deep breaths using this tool can help you keep your lungs clear and active. This may help to reverse or lessen your chance of developing breathing (pulmonary) problems, especially infection. You may be asked to use a spirometer: After a surgery. If you have a lung problem or a history of smoking. After a long period of time when you have been unable to move or be active. If the spirometer includes an indicator to show the highest number that you have reached, your health care provider or respiratory therapist will help you set a goal. Keep a log of your progress as told by your health care provider. What are the risks? Breathing too quickly may cause dizziness or cause you to pass out. Take your time so you do not get dizzy  or light-headed. If you are in pain, you may need to take pain medicine before doing incentive spirometry. It is harder to take a deep breath if you are having pain. How to use your incentive spirometer  Sit up on the edge of your bed or on a chair. Hold the incentive spirometer so that it is in an upright position. Before you use the spirometer, breathe out normally. Place the mouthpiece in your mouth. Make sure your lips are closed tightly around it. Breathe in slowly and as deeply as you can through your mouth, causing the piston or the ball to rise toward the top of the  chamber. Hold your breath for 3-5 seconds, or for as long as possible. If the spirometer includes a coach indicator, use this to guide you in breathing. Slow down your breathing if the indicator goes above the marked areas. Remove the mouthpiece from your mouth and breathe out normally. The piston or ball will return to the bottom of the chamber. Rest for a few seconds, then repeat the steps 10 or more times. Take your time and take a few normal breaths between deep breaths so that you do not get dizzy or light-headed. Do this every 1-2 hours when you are awake. If the spirometer includes a goal marker to show the highest number you have reached (best effort), use this as a goal to work toward during each repetition. After each set of 10 deep breaths, cough a few times. This will help to make sure that your lungs are clear. If you have an incision on your chest or abdomen from surgery, place a pillow or a rolled-up towel firmly against the incision when you cough. This can help to reduce pain while taking deep breaths and coughing. General tips When you are able to get out of bed: Walk around often. Continue to take deep breaths and cough in order to clear your lungs. Keep using the incentive spirometer until your health care provider says it is okay to stop using it. If you have been in the hospital, you may be told to keep  using the spirometer at home. Contact a health care provider if: You are having difficulty using the spirometer. You have trouble using the spirometer as often as instructed. Your pain medicine is not giving enough relief for you to use the spirometer as told. You have a fever. Get help right away if: You develop shortness of breath. You develop a cough with bloody mucus from the lungs. You have fluid or blood coming from an incision site after you cough. Summary An incentive spirometer is a tool that can help you learn to take long, deep breaths to keep your lungs clear and active. You may be asked to use a spirometer after a surgery, if you have a lung problem or a history of smoking, or if you have been inactive for a long period of time. Use your incentive spirometer as instructed every 1-2 hours while you are awake. If you have an incision on your chest or abdomen, place a pillow or a rolled-up towel firmly against your incision when you cough. This will help to reduce pain. Get help right away if you have shortness of breath, you cough up bloody mucus, or blood comes from your incision when you cough. This information is not intended to replace advice given to you by your health care provider. Make sure you discuss any questions you have with your health care provider. Document Revised: 12/20/2019 Document Reviewed: 12/20/2019 Elsevier Patient Education  2023 [employer] Inc[employer]        Preoperative Educational Videos for Total Hip, Knee and Shoulder Replacements  To better prepare for surgery, please view our videos that explain the physical activity and discharge planning required to have the best surgical recovery at [facility].  [email or web address]  Questions? Call [phone] or email [email or web address]

## 2024-07-13 NOTE — Progress Notes (Addendum)
 History of Present Illness: The patient is an 63 y.o. female seen in clinic today for history and physical for right total knee arthroplasty with Dr. Lorelle on 07/15/2024. Patient has had severe debilitating pain that has been increasing over the last year. She has had cortisone injections with only a few weeks worth of relief. X-rays show severe degenerative changes with complete loss of joint space throughout the right knee. Patient's pain is interfered with her quality of life and activities day living.  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 Follows at [facility].  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 (CMS/HHS-HCC) 04/01/2019  Varicose veins of both lower extremities 03/28/2015   Past Surgical History: Past Surgical History:  Procedure Laterality Date  CARDIAC CATHETERIZATION 2011  normal  Limited arthroscopic debridement, arthroscopic subacromial decompression, mini-open repair of massive rotator cuff tear,right shoulder Right 09/28/2015  Dr.Poggi  COLONOSCOPY 12/15/2018  Normal colon/PHx CP/Repeat 34yrs/MUS  Hand surgery Left   Past Family History: Family History  Problem Relation Age of Onset  Myocardial Infarction (Heart attack) Father  Diabetes type II Father  Deceased  Lupus Mother  Deceased  High blood pressure (Hypertension) Other  Diabetes Other  Heart  disease Other  Diabetes type II Sister  High blood pressure (Hypertension) Sister  Diabetes type II Sister   Medications: Current Outpatient Medications  Medication Sig Dispense Refill  ACCU-CHEK GUIDE TEST STRIPS test strip USE 5 TIMES DAILY AS INSTRUCTED. 450 strip 3  acetaminophen  (TYLENOL ) 500 MG tablet Take 1,000 mg by mouth as needed for Pain.  albuterol 90 mcg/actuation inhaler Inhale 2 inhalations into the lungs every 6 (six) hours as needed for Wheezing (Patient not taking: Reported on 09/15/2023) 1 each 2  amLODIPine (NORVASC) 5 MG tablet TAKE 1 TABLET BY MOUTH EVERY DAY 90 tablet 1  aspirin 81 MG EC tablet Take 81 mg by mouth once daily.  CONTOUR NEXT METER Misc Use to check blood sugars daily (Patient not taking: Reported on 09/09/2022) 1 each 0  dapagliflozin propanediol (FARXIGA) 10 mg tablet Take 1 tablet (10 mg total) by mouth once daily 90 tablet 3  EPIPEN  2-PAK 0.3 mg/0.3 mL pen injector Inject 0.3 mg into the muscle as needed 3  insulin  ASPART (NOVOLOG  U-100 INSULIN  ASPART) injection (concentration 100 units/mL) USE VIA INSULIN  PUMP, MAXIMUM OF 120 UNITS PER DAY 110 mL 3  insulin  LISPRO (ADMELOG, HUMALOG) injection (concentration 100 units/mL) USE VIA INSULIN  PUMP, MAXIMUM OF 120 UNITS PER DAY (Patient not taking: Reported on 03/22/2024) 110 mL 3  lancets Use as instructed 8 times daily (Patient not taking: Reported on 09/09/2022) 900 each 3  losartan (COZAAR) 100 MG tablet TAKE 1 TABLET ONCE DAILY 90 tablet 1  metoprolol SUCCinate (TOPROL-XL) 100 MG XL tablet TAKE 1 TABLET ONCE DAILY 90 tablet 1  omeprazole (PRILOSEC) 40 MG DR capsule Take 40 mg by mouth once daily  simvastatin (ZOCOR) 20 MG tablet TAKE 1 TABLET ONCE DAILY 90 tablet 1   No current  facility-administered medications for this visit.   Allergies: Allergies  Allergen Reactions  Malt Extract Itching, Rash, Shortness Of Breath and Cough  Adhesive Itching  Azithromycin Diarrhea  Z-Pak  Glucophage [Metformin]  Diarrhea  Lactalbumin Other (See Comments)  Lovastatin Muscle Pain  Other Unknown  Decongestants  Tapentadol Itching  Vytorin 10-10 [Ezetimibe-Simvastatin] Muscle Pain and Other (See Comments)  joint pain  Wheat Cough  rash  Adhesive Tape-Silicones Itching and Rash    Visit Vitals: Vitals:  07/13/24 1546  BP: (!) 140/82    Review of Systems:  A comprehensive 14 point ROS was performed, reviewed, and the pertinent orthopaedic findings are documented in the HPI.  Physical Exam: General:  Well developed, well nourished, no apparent distress, normal affect, antalgic gait with no assistive device.  HEENT: Head normocephalic, atraumatic, PERRL.   Abdomen: Soft, non tender, non distended, Bowel sounds present.  Heart: Examination of the heart reveals regular, rate, and rhythm. There is no murmur noted on ascultation. There is a normal apical pulse.  Lungs: Lungs are clear to auscultation. There is no wheeze, rhonchi, or crackles. There is normal expansion of bilateral chest walls.   Comprehensive Knee Exam: Gait Non-antalgic and fluid  Alignment Neutral   Inspection Right Left  Skin Normal appearance with no obvious deformity. No ecchymosis or erythema. Normal appearance with no obvious deformity. No ecchymosis or erythema.  Soft Tissue No focal soft tissue swelling No focal soft tissue swelling  Quad Atrophy None None   Palpation  Right Left  Tenderness Medial joint line tenderness to palpation Mild medial joint line tenderness to palpation  Crepitus + patellofemoral and tibiofemoral crepitus No patellofemoral or tibiofemoral crepitus  Effusion None None   Range of Motion Right Left  Flexion 0-95 0-120  Extension Full knee extension without hyperextension Full knee extension without hyperextension   Ligamentous Exam Right Left  Lachman Normal Normal  Valgus 0 1+ with endpoint partially correctable varus deformity Normal  Valgus 30 Normal Normal  Varus 0  Normal Normal  Varus 30 Normal Normal  Anterior Drawer Normal Normal  Posterior Drawer Normal Normal   Meniscal Exam Right Left  Hyperflexion Test Positive Negative  Hyperextension Test Positive Positive  McMurray's Positive Negative   Neurovascular Right Left  Quadriceps Strength 5/5 5/5  Hamstring Strength 5/5 5/5  Hip Abductor Strength 4/5 4/5  Distal Motor Normal Normal  Distal Sensory Normal light touch sensation Normal light touch sensation  Distal Pulses Normal Normal    Imaging Studies: X-rays of the right knee reviewed by me today show severe degenerative changes with tricompartmental, sclerosis, osteophyte formation, and cystic changes. There is medial bone-on-bone articulation with lateral subluxation of the tibia. Kellgren-Lawrence grade 4. AP, sunrise, and flexed PA of the left knee also show tricompartmental degenerative changes with joint space narrowing, sclerosis, osteophyte formation and medial bone-on-bone articulation. There is cystic changes as well in the medial knee. Kellgren-Lawrence grade 4. No fractures or dislocations noted.   Assessment:  Advanced right knee osteoarthritis  Plan: Tiffany Ellis is a 63 year old female presents with advanced right knee osteoarthritis. She has complete loss of joint space throughout the right knee. Pain is interfered with her quality of life and activities of daily living. She has had conservative treatment with very little relief. She is seen Dr. Lorelle, risks, benefits, complications of a right total knee arthroplasty have been discussed with the patient. Patient has agreed and consented to the procedure with Dr. Lorelle on 07/15/2024.  The hospitalization and post-operative care and  rehabilitation were also discussed. The use of perioperative antibiotics and DVT prophylaxis were discussed. The risk, benefits and alternatives to a surgical intervention were discussed at length with the patient. The patient was also advised of risks  related to the medical comorbidities and elevated body mass index (BMI). A lengthy discussion took place to review the most common complications including but not limited to: stiffness, loss of function, complex regional pain syndrome, deep vein thrombosis, pulmonary embolus, heart attack, stroke, infection, wound breakdown, numbness, intraoperative fracture, damage to nerves, tendon,muscles, arteries or other blood vessels, death and other possible complications from anesthesia. The patient was told that we will take steps to minimize these risks by using sterile technique, antibiotics and DVT prophylaxis when appropriate and follow the patient postoperatively in the office setting to monitor progress. The possibility of recurrent pain, no improvement in pain and actual worsening of pain were also discussed with the patient.

## 2024-07-15 ENCOUNTER — Other Ambulatory Visit: Payer: Self-pay

## 2024-07-15 ENCOUNTER — Encounter: Payer: Self-pay | Admitting: Orthopedic Surgery

## 2024-07-15 ENCOUNTER — Ambulatory Visit
Admission: RE | Admit: 2024-07-15 | Discharge: 2024-07-16 | Disposition: A | Discharge status: Home Health | Source: Ambulatory Visit | Attending: Orthopedic Surgery | Admitting: Orthopedic Surgery

## 2024-07-15 ENCOUNTER — Ambulatory Visit: Admitting: Anesthesiology

## 2024-07-15 ENCOUNTER — Ambulatory Visit: Payer: Self-pay | Admitting: Urgent Care

## 2024-07-15 ENCOUNTER — Encounter
Admission: RE | Disposition: A | Discharge status: Home Health | Payer: Self-pay | Source: Ambulatory Visit | Attending: Orthopedic Surgery

## 2024-07-15 ENCOUNTER — Ambulatory Visit

## 2024-07-15 DIAGNOSIS — M1990 Unspecified osteoarthritis, unspecified site: Secondary | ICD-10-CM | POA: Diagnosis not present

## 2024-07-15 DIAGNOSIS — Z794 Long term (current) use of insulin: Secondary | ICD-10-CM | POA: Insufficient documentation

## 2024-07-15 DIAGNOSIS — I129 Hypertensive chronic kidney disease with stage 1 through stage 4 chronic kidney disease, or unspecified chronic kidney disease: Secondary | ICD-10-CM | POA: Insufficient documentation

## 2024-07-15 DIAGNOSIS — E109 Type 1 diabetes mellitus without complications: Secondary | ICD-10-CM | POA: Insufficient documentation

## 2024-07-15 DIAGNOSIS — E66812 Obesity, class 2: Secondary | ICD-10-CM | POA: Diagnosis not present

## 2024-07-15 DIAGNOSIS — Z6837 Body mass index (BMI) 37.0-37.9, adult: Secondary | ICD-10-CM | POA: Diagnosis not present

## 2024-07-15 DIAGNOSIS — K219 Gastro-esophageal reflux disease without esophagitis: Secondary | ICD-10-CM | POA: Insufficient documentation

## 2024-07-15 DIAGNOSIS — E1022 Type 1 diabetes mellitus with diabetic chronic kidney disease: Secondary | ICD-10-CM | POA: Diagnosis not present

## 2024-07-15 DIAGNOSIS — N183 Chronic kidney disease, stage 3 unspecified: Secondary | ICD-10-CM | POA: Diagnosis not present

## 2024-07-15 DIAGNOSIS — D869 Sarcoidosis, unspecified: Secondary | ICD-10-CM | POA: Insufficient documentation

## 2024-07-15 DIAGNOSIS — Z01812 Encounter for preprocedural laboratory examination: Secondary | ICD-10-CM

## 2024-07-15 DIAGNOSIS — M1711 Unilateral primary osteoarthritis, right knee: Secondary | ICD-10-CM | POA: Diagnosis present

## 2024-07-15 DIAGNOSIS — E1021 Type 1 diabetes mellitus with diabetic nephropathy: Secondary | ICD-10-CM | POA: Diagnosis not present

## 2024-07-15 DIAGNOSIS — Z96651 Presence of right artificial knee joint: Secondary | ICD-10-CM

## 2024-07-15 HISTORY — PX: TOTAL KNEE ARTHROPLASTY: SHX125

## 2024-07-15 LAB — GLUCOSE, CAPILLARY
Glucose-Capillary: 211 mg/dL — ABNORMAL HIGH (ref 70–99)
Glucose-Capillary: 176 mg/dL — ABNORMAL HIGH (ref 70–99)
Glucose-Capillary: 183 mg/dL — ABNORMAL HIGH (ref 70–99)
Glucose-Capillary: 170 mg/dL — ABNORMAL HIGH (ref 70–99)
Glucose-Capillary: 322 mg/dL — ABNORMAL HIGH (ref 70–99)

## 2024-07-15 SURGERY — ARTHROPLASTY, KNEE, TOTAL
Anesthesia: Spinal | Site: Knee | Laterality: Right

## 2024-07-15 MED ORDER — PHENYLEPHRINE HCL-NACL 20-0.9 MG/250ML-% IV SOLN
INTRAVENOUS | Status: AC
  Filled 2024-07-15: qty 250

## 2024-07-15 MED ORDER — ACETAMINOPHEN 325 MG PO TABS: 325.0000 mg | ORAL_TABLET | Freq: Four times a day (QID) | ORAL | Status: DC | PRN

## 2024-07-15 MED ORDER — PROPOFOL 1000 MG/100ML IV EMUL
INTRAVENOUS | Status: AC
  Filled 2024-07-15: qty 100

## 2024-07-15 MED ORDER — ONDANSETRON HCL 4 MG/2ML IJ SOLN: 4.0000 mg | Freq: Four times a day (QID) | INTRAMUSCULAR | Status: DC | PRN

## 2024-07-15 MED ORDER — CHLORHEXIDINE GLUCONATE 0.12 % MT SOLN
OROMUCOSAL | Status: AC
  Filled 2024-07-15: qty 15

## 2024-07-15 MED ORDER — TRANEXAMIC ACID-NACL 1000-0.7 MG/100ML-% IV SOLN
INTRAVENOUS | Status: AC
Start: 2024-07-15 — End: 2024-07-15
  Filled 2024-07-15: qty 100

## 2024-07-15 MED ORDER — FENTANYL CITRATE (PF) 100 MCG/2ML IJ SOLN
INTRAMUSCULAR | Status: DC | PRN
  Administered 2024-07-15: 25 ug via INTRAVENOUS
  Administered 2024-07-15: 50 ug via INTRAVENOUS
  Administered 2024-07-15: 25 ug via INTRAVENOUS

## 2024-07-15 MED ORDER — DOCUSATE SODIUM 100 MG PO CAPS
100.0000 mg | ORAL_CAPSULE | Freq: Two times a day (BID) | ORAL | 0 refills | Status: AC
  Filled 2024-07-15: qty 10, 5d supply, fill #0

## 2024-07-15 MED ORDER — CEFAZOLIN SODIUM-DEXTROSE 2-4 GM/100ML-% IV SOLN
INTRAVENOUS | Status: AC
  Filled 2024-07-15: qty 100

## 2024-07-15 MED ORDER — BUPIVACAINE-EPINEPHRINE (PF) 0.5% -1:200000 IJ SOLN
INTRAMUSCULAR | Status: AC
  Filled 2024-07-15: qty 30

## 2024-07-15 MED ORDER — CEFAZOLIN SODIUM-DEXTROSE 2-4 GM/100ML-% IV SOLN
2.0000 g | INTRAVENOUS | Status: AC
Start: 2024-07-15 — End: 2024-07-15
  Administered 2024-07-15: 2 g via INTRAVENOUS

## 2024-07-15 MED ORDER — TRAMADOL HCL 50 MG PO TABS
50.0000 mg | ORAL_TABLET | Freq: Four times a day (QID) | ORAL | Status: DC | PRN
  Administered 2024-07-15 – 2024-07-16 (×3): 50 mg via ORAL
  Filled 2024-07-15 (×4): qty 1

## 2024-07-15 MED ORDER — SODIUM CHLORIDE (PF) 0.9 % IJ SOLN
INTRAMUSCULAR | Status: AC
  Filled 2024-07-15: qty 20

## 2024-07-15 MED ORDER — OXYCODONE HCL 5 MG PO TABS
2.5000 mg | ORAL_TABLET | Freq: Four times a day (QID) | ORAL | 0 refills | Status: AC | PRN
  Filled 2024-07-15: qty 20, 5d supply, fill #0

## 2024-07-15 MED ORDER — SODIUM CHLORIDE 0.9 % IV SOLN: INTRAVENOUS | Status: DC

## 2024-07-15 MED ORDER — BUPIVACAINE LIPOSOME 1.3 % IJ SUSP
INTRAMUSCULAR | Status: AC
  Filled 2024-07-15: qty 20

## 2024-07-15 MED ORDER — PHENOL 1.4 % MT LIQD: 1.0000 | OROMUCOSAL | Status: DC | PRN

## 2024-07-15 MED ORDER — DEXAMETHASONE SODIUM PHOSPHATE 10 MG/ML IJ SOLN
INTRAMUSCULAR | Status: AC
Start: 2024-07-15 — End: 2024-07-15
  Filled 2024-07-15: qty 1

## 2024-07-15 MED ORDER — MIDAZOLAM HCL 5 MG/5ML IJ SOLN
INTRAMUSCULAR | Status: DC | PRN
  Administered 2024-07-15: 2 mg via INTRAVENOUS

## 2024-07-15 MED ORDER — TRAMADOL HCL 50 MG PO TABS
50.0000 mg | ORAL_TABLET | Freq: Four times a day (QID) | ORAL | 0 refills | Status: AC | PRN
  Filled 2024-07-15: qty 30, 7d supply, fill #0

## 2024-07-15 MED ORDER — PROPOFOL 10 MG/ML IV BOLUS
INTRAVENOUS | Status: DC | PRN
  Administered 2024-07-15: 10 mg via INTRAVENOUS

## 2024-07-15 MED ORDER — APIXABAN 2.5 MG PO TABS
2.5000 mg | ORAL_TABLET | Freq: Two times a day (BID) | ORAL | Status: DC
  Administered 2024-07-16: 2.5 mg via ORAL
  Filled 2024-07-15: qty 1

## 2024-07-15 MED ORDER — TRANEXAMIC ACID-NACL 1000-0.7 MG/100ML-% IV SOLN: 1000.0000 mg | INTRAVENOUS | Status: DC

## 2024-07-15 MED ORDER — AMLODIPINE BESYLATE 5 MG PO TABS
5.0000 mg | ORAL_TABLET | Freq: Every day | ORAL | Status: DC
  Administered 2024-07-15 – 2024-07-16 (×2): 5 mg via ORAL
  Filled 2024-07-15: qty 1

## 2024-07-15 MED ORDER — ONDANSETRON HCL 4 MG/2ML IJ SOLN: 4.0000 mg | Freq: Once | INTRAMUSCULAR | Status: DC | PRN

## 2024-07-15 MED ORDER — MUPIROCIN 2 % EX OINT
1.0000 | TOPICAL_OINTMENT | Freq: Two times a day (BID) | CUTANEOUS | 0 refills | Status: AC
  Filled 2024-07-15: qty 22, 11d supply, fill #0

## 2024-07-15 MED ORDER — MORPHINE SULFATE (PF) 4 MG/ML IV SOLN: 0.5000 mg | INTRAVENOUS | Status: DC | PRN

## 2024-07-15 MED ORDER — ACETAMINOPHEN 10 MG/ML IV SOLN: 1000.0000 mg | Freq: Once | INTRAVENOUS | Status: DC | PRN

## 2024-07-15 MED ORDER — HYDROCODONE-ACETAMINOPHEN 5-325 MG PO TABS
ORAL_TABLET | ORAL | Status: AC
  Filled 2024-07-15: qty 1

## 2024-07-15 MED ORDER — METOCLOPRAMIDE HCL 5 MG PO TABS: 5.0000 mg | ORAL_TABLET | Freq: Three times a day (TID) | ORAL | Status: DC | PRN

## 2024-07-15 MED ORDER — BUPIVACAINE HCL (PF) 0.5 % IJ SOLN
INTRAMUSCULAR | Status: AC
  Filled 2024-07-15: qty 10

## 2024-07-15 MED ORDER — DEXAMETHASONE SODIUM PHOSPHATE 10 MG/ML IJ SOLN
8.0000 mg | Freq: Once | INTRAMUSCULAR | Status: AC
Start: 2024-07-15 — End: 2024-07-15
  Administered 2024-07-15: 8 mg via INTRAVENOUS

## 2024-07-15 MED ORDER — PANTOPRAZOLE SODIUM 40 MG PO TBEC
40.0000 mg | DELAYED_RELEASE_TABLET | Freq: Every day | ORAL | Status: DC
  Administered 2024-07-16: 40 mg via ORAL

## 2024-07-15 MED ORDER — DOCUSATE SODIUM 100 MG PO CAPS
ORAL_CAPSULE | ORAL | Status: AC
  Filled 2024-07-15: qty 1

## 2024-07-15 MED ORDER — CHLORHEXIDINE GLUCONATE 4 % EX SOLN: 1.0000 | CUTANEOUS | 1 refills | Status: DC

## 2024-07-15 MED ORDER — METOCLOPRAMIDE HCL 5 MG/ML IJ SOLN: 5.0000 mg | Freq: Three times a day (TID) | INTRAMUSCULAR | Status: DC | PRN

## 2024-07-15 MED ORDER — DOCUSATE SODIUM 100 MG PO CAPS
100.0000 mg | ORAL_CAPSULE | Freq: Two times a day (BID) | ORAL | Status: DC
  Administered 2024-07-15: 100 mg via ORAL
  Filled 2024-07-15: qty 1

## 2024-07-15 MED ORDER — ACETAMINOPHEN 10 MG/ML IV SOLN
INTRAVENOUS | Status: DC | PRN
  Administered 2024-07-15: 1000 mg via INTRAVENOUS

## 2024-07-15 MED ORDER — ONDANSETRON HCL 4 MG PO TABS
4.0000 mg | ORAL_TABLET | Freq: Four times a day (QID) | ORAL | 0 refills | Status: AC | PRN
  Filled 2024-07-15: qty 12, 3d supply, fill #0

## 2024-07-15 MED ORDER — MUPIROCIN 2 % EX OINT: 1.0000 | TOPICAL_OINTMENT | Freq: Two times a day (BID) | CUTANEOUS | 0 refills | Status: DC

## 2024-07-15 MED ORDER — SIMVASTATIN 20 MG PO TABS
20.0000 mg | ORAL_TABLET | Freq: Every day | ORAL | Status: DC
  Administered 2024-07-16: 20 mg via ORAL
  Filled 2024-07-15: qty 1

## 2024-07-15 MED ORDER — CHLORHEXIDINE GLUCONATE 0.12 % MT SOLN
15.0000 mL | Freq: Once | OROMUCOSAL | Status: AC
  Administered 2024-07-15: 15 mL via OROMUCOSAL

## 2024-07-15 MED ORDER — SURGIPHOR WOUND IRRIGATION SYSTEM - OPTIME: TOPICAL | Status: DC | PRN

## 2024-07-15 MED ORDER — OXYCODONE HCL 5 MG/5ML PO SOLN: 5.0000 mg | Freq: Once | ORAL | Status: DC | PRN

## 2024-07-15 MED ORDER — PHENYLEPHRINE HCL-NACL 20-0.9 MG/250ML-% IV SOLN
INTRAVENOUS | Status: DC | PRN
  Administered 2024-07-15: 80 ug via INTRAVENOUS
  Administered 2024-07-15: 30 ug/min via INTRAVENOUS

## 2024-07-15 MED ORDER — ORAL CARE MOUTH RINSE: 15.0000 mL | Freq: Once | OROMUCOSAL | Status: AC

## 2024-07-15 MED ORDER — APIXABAN 2.5 MG PO TABS
2.5000 mg | ORAL_TABLET | Freq: Two times a day (BID) | ORAL | 0 refills | Status: AC
  Filled 2024-07-15: qty 56, 28d supply, fill #0

## 2024-07-15 MED ORDER — PROPOFOL 500 MG/50ML IV EMUL
INTRAVENOUS | Status: DC | PRN
  Administered 2024-07-15: 100 ug/kg/min via INTRAVENOUS

## 2024-07-15 MED ORDER — ACETAMINOPHEN 10 MG/ML IV SOLN
INTRAVENOUS | Status: AC
Start: 2024-07-15 — End: 2024-07-15
  Filled 2024-07-15: qty 100

## 2024-07-15 MED ORDER — FENTANYL CITRATE (PF) 100 MCG/2ML IJ SOLN: 25.0000 ug | INTRAMUSCULAR | Status: DC | PRN

## 2024-07-15 MED ORDER — LACTATED RINGERS IV SOLN: INTRAVENOUS | Status: DC

## 2024-07-15 MED ORDER — ACETAMINOPHEN 500 MG PO TABS
1000.0000 mg | ORAL_TABLET | Freq: Three times a day (TID) | ORAL | Status: DC
  Administered 2024-07-16 (×2): 1000 mg via ORAL
  Filled 2024-07-15 (×2): qty 2

## 2024-07-15 MED ORDER — MENTHOL 3 MG MT LOZG: 1.0000 | LOZENGE | OROMUCOSAL | Status: DC | PRN

## 2024-07-15 MED ORDER — OXYCODONE HCL 5 MG PO TABS: 5.0000 mg | ORAL_TABLET | Freq: Once | ORAL | Status: DC | PRN

## 2024-07-15 MED ORDER — CEFAZOLIN SODIUM-DEXTROSE 2-4 GM/100ML-% IV SOLN
2.0000 g | Freq: Four times a day (QID) | INTRAVENOUS | Status: AC
  Administered 2024-07-15 (×2): 2 g via INTRAVENOUS
  Filled 2024-07-15 (×2): qty 100

## 2024-07-15 MED ORDER — FENTANYL CITRATE (PF) 100 MCG/2ML IJ SOLN
INTRAMUSCULAR | Status: AC
  Filled 2024-07-15: qty 2

## 2024-07-15 MED ORDER — ONDANSETRON HCL 4 MG PO TABS: 4.0000 mg | ORAL_TABLET | Freq: Four times a day (QID) | ORAL | Status: DC | PRN

## 2024-07-15 MED ORDER — INSULIN PUMP
SUBCUTANEOUS | Status: DC
  Administered 2024-07-15: 5.4 via SUBCUTANEOUS
  Administered 2024-07-16: 7.9 via SUBCUTANEOUS
  Administered 2024-07-16: 8.3 via SUBCUTANEOUS
  Administered 2024-07-16: 8.4 via SUBCUTANEOUS
  Filled 2024-07-15: qty 1

## 2024-07-15 MED ORDER — ASPIRIN 81 MG PO TBEC
81.0000 mg | DELAYED_RELEASE_TABLET | Freq: Every day | ORAL | Status: DC
  Administered 2024-07-16: 81 mg via ORAL

## 2024-07-15 MED ORDER — MIDAZOLAM HCL 2 MG/2ML IJ SOLN
INTRAMUSCULAR | Status: AC
  Filled 2024-07-15: qty 2

## 2024-07-15 MED ORDER — HYDROCODONE-ACETAMINOPHEN 5-325 MG PO TABS
1.0000 | ORAL_TABLET | ORAL | Status: DC | PRN
  Administered 2024-07-15: 1 via ORAL
  Administered 2024-07-15 – 2024-07-16 (×2): 2 via ORAL
  Filled 2024-07-15 (×2): qty 1
  Filled 2024-07-15: qty 2
  Filled 2024-07-15: qty 1

## 2024-07-15 MED ORDER — LOSARTAN POTASSIUM 50 MG PO TABS
100.0000 mg | ORAL_TABLET | Freq: Every day | ORAL | Status: DC
  Administered 2024-07-15 – 2024-07-16 (×2): 100 mg via ORAL
  Filled 2024-07-15 (×2): qty 2

## 2024-07-15 MED ORDER — TRANEXAMIC ACID-NACL 1000-0.7 MG/100ML-% IV SOLN
INTRAVENOUS | Status: DC | PRN
  Administered 2024-07-15 (×2): 1000 mg via INTRAVENOUS

## 2024-07-15 MED ORDER — CHLORHEXIDINE GLUCONATE 4 % EX SOLN
1.0000 | CUTANEOUS | 1 refills | Status: AC
  Filled 2024-07-15: qty 472, 15d supply, fill #0

## 2024-07-15 MED ORDER — SODIUM CHLORIDE 0.9 % IR SOLN
Status: DC | PRN
  Administered 2024-07-15: 3000 mL

## 2024-07-15 MED ORDER — SODIUM CHLORIDE (PF) 0.9 % IJ SOLN
INTRAMUSCULAR | Status: DC | PRN
  Administered 2024-07-15: 70 mL

## 2024-07-15 MED ORDER — ONDANSETRON HCL 4 MG/2ML IJ SOLN
INTRAMUSCULAR | Status: DC | PRN
  Administered 2024-07-15: 4 mg via INTRAVENOUS

## 2024-07-15 MED ORDER — SODIUM CHLORIDE 0.9 % IV SOLN
INTRAVENOUS | Status: DC
Start: 2024-07-15 — End: 2024-07-15

## 2024-07-15 MED ORDER — METOPROLOL SUCCINATE ER 25 MG PO TB24
100.0000 mg | ORAL_TABLET | Freq: Every day | ORAL | Status: DC
Start: 2024-07-16 — End: 2024-07-16
  Administered 2024-07-16: 100 mg via ORAL
  Filled 2024-07-15: qty 4

## 2024-07-15 MED ORDER — ONDANSETRON HCL 4 MG/2ML IJ SOLN
INTRAMUSCULAR | Status: AC
Start: 2024-07-15 — End: 2024-07-15
  Filled 2024-07-15: qty 2

## 2024-07-15 MED ORDER — BUPIVACAINE HCL (PF) 0.5 % IJ SOLN
INTRAMUSCULAR | Status: DC | PRN
  Administered 2024-07-15: 2.8 mL via INTRATHECAL

## 2024-07-15 MED ORDER — ORAL CARE MOUTH RINSE: 15.0000 mL | OROMUCOSAL | Status: DC | PRN

## 2024-07-15 SURGICAL SUPPLY — 58 items
BLADE PATELLA REAM PILOT HOLE (MISCELLANEOUS) IMPLANT
BLADE SAW 90X13X1.19 OSCILLAT (BLADE) IMPLANT
BLADE SAW SAG 25X90X1.19 (BLADE) ×1 IMPLANT
BLADE SAW SAG 29X58X.64 (BLADE) ×1 IMPLANT
BNDG ELASTIC 6INX 5YD STR LF (GAUZE/BANDAGES/DRESSINGS) ×1 IMPLANT
BOWL CEMENT MIX W/ADAPTER (MISCELLANEOUS) IMPLANT
BRUSH SCRUB EZ PLAIN DRY (MISCELLANEOUS) IMPLANT
CHLORAPREP W/TINT 26 (MISCELLANEOUS) ×2 IMPLANT
COMPONENT PATELLA 3 PEG 35 (Joint) IMPLANT
COOLER ICEMAN CLASSIC (MISCELLANEOUS) ×1 IMPLANT
CUFF TRNQT CYL 24X4X16.5-23 (TOURNIQUET CUFF) IMPLANT
CUFF TRNQT CYL 30X4X21-28X (TOURNIQUET CUFF) IMPLANT
DERMABOND ADVANCED .7 DNX12 (GAUZE/BANDAGES/DRESSINGS) ×1 IMPLANT
DRAPE SHEET LG 3/4 BI-LAMINATE (DRAPES) ×2 IMPLANT
DRSG MEPILEX SACRM 8.7X9.8 (GAUZE/BANDAGES/DRESSINGS) ×1 IMPLANT
DRSG OPSITE POSTOP 4X10 (GAUZE/BANDAGES/DRESSINGS) IMPLANT
DRSG OPSITE POSTOP 4X8 (GAUZE/BANDAGES/DRESSINGS) IMPLANT
ELECTRODE REM PT RTRN 9FT ADLT (ELECTROSURGICAL) ×1 IMPLANT
GLOVE BIO SURGEON STRL SZ8 (GLOVE) ×1 IMPLANT
GLOVE BIOGEL PI IND STRL 8 (GLOVE) ×1 IMPLANT
GLOVE PI ORTHO PRO STRL 7.5 (GLOVE) ×2 IMPLANT
GLOVE PI ORTHO PRO STRL SZ8 (GLOVE) ×2 IMPLANT
GLOVE SURG SYN 7.5 PF PI (GLOVE) ×1 IMPLANT
GOWN SRG XL LVL 3 NONREINFORCE (GOWNS) ×1 IMPLANT
GOWN STRL REUS W/ TWL LRG LVL3 (GOWN DISPOSABLE) ×1 IMPLANT
GOWN STRL REUS W/ TWL XL LVL3 (GOWN DISPOSABLE) ×1 IMPLANT
HOOD PEEL AWAY T7 (MISCELLANEOUS) ×2 IMPLANT
INSERT TIBIA KNEE RIGHT 10 (Joint) IMPLANT
KIT TURNOVER KIT A (KITS) ×1 IMPLANT
MANIFOLD NEPTUNE II (INSTRUMENTS) ×1 IMPLANT
MARKER SKIN DUAL TIP RULER LAB (MISCELLANEOUS) ×1 IMPLANT
MAT ABSORB FLUID 56X50 GRAY (MISCELLANEOUS) ×1 IMPLANT
NDL HYPO 21X1.5 SAFETY (NEEDLE) ×1 IMPLANT
NEEDLE HYPO 21X1.5 SAFETY (NEEDLE) ×1 IMPLANT
PACK TOTAL KNEE (MISCELLANEOUS) ×1 IMPLANT
PAD ARMBOARD POSITIONER FOAM (MISCELLANEOUS) ×3 IMPLANT
PAD COLD UNI WRAP-ON (PAD) ×1 IMPLANT
PENCIL SMOKE EVACUATOR (MISCELLANEOUS) ×1 IMPLANT
PIN DRILL HDLS TROCAR 75 4PK (PIN) IMPLANT
PROSTHESIS FEM KNEE PS STD9 RT (Joint) IMPLANT
PROSTHESIS TIB KNEE PS 0D F RT (Joint) IMPLANT
SCREW FEMALE HEX FIX 25X2.5 (ORTHOPEDIC DISPOSABLE SUPPLIES) IMPLANT
SCREW HEX HEADED 3.5X27 DISP (ORTHOPEDIC DISPOSABLE SUPPLIES) IMPLANT
SLEEVE SCD COMPRESS KNEE MED (STOCKING) ×1 IMPLANT
SOL .9 NS 3000ML IRR UROMATIC (IV SOLUTION) ×1 IMPLANT
SOLN STERILE WATER 1000 ML (IV SOLUTION) ×1 IMPLANT
SOLN STERILE WATER BTL 1000 ML (IV SOLUTION) ×1 IMPLANT
SOLUTION IRRIG SURGIPHOR (IV SOLUTION) ×1 IMPLANT
STOCKINETTE IMPERV 14X48 (MISCELLANEOUS) ×1 IMPLANT
SUT STRATAFIX 14 PDO 36 VLT (SUTURE) ×1 IMPLANT
SUT VIC AB 0 CT1 36 (SUTURE) ×1 IMPLANT
SUT VIC AB 2-0 CT2 27 (SUTURE) ×2 IMPLANT
SUTURE STRATA SPIR 4-0 18 (SUTURE) ×1 IMPLANT
SUTURE VICRYL 1-0 27IN ABS (SUTURE) ×1 IMPLANT
SYR 20ML LL LF (SYRINGE) ×2 IMPLANT
TAPE CLOTH 3X10 WHT NS LF (GAUZE/BANDAGES/DRESSINGS) ×1 IMPLANT
TIP FAN IRRIG PULSAVAC PLUS (DISPOSABLE) ×1 IMPLANT
TRAP FLUID SMOKE EVACUATOR (MISCELLANEOUS) ×1 IMPLANT

## 2024-07-15 NOTE — Anesthesia Procedure Notes (Signed)
 Procedure Name: MAC Date/Time: 07/15/2024 7:37 AM  Performed by: Lorrene Camelia LABOR, CRNAPre-anesthesia Checklist: Patient identified, Emergency Drugs available, Suction available and Patient being monitored Patient Re-evaluated:Patient Re-evaluated prior to induction Oxygen Delivery Method: Simple face mask Preoxygenation: Pre-oxygenation with 100% oxygen Induction Type: IV induction

## 2024-07-15 NOTE — Anesthesia Postprocedure Evaluation (Signed)
 Anesthesia Post Note  Patient: Tiffany Ellis  Procedure(s) Performed: ARTHROPLASTY, KNEE, TOTAL (Right: Knee)  Patient location during evaluation: PACU Anesthesia Type: Spinal Level of consciousness: awake and alert Pain management: pain level controlled Vital Signs Assessment: post-procedure vital signs reviewed and stable Respiratory status: spontaneous breathing and respiratory function stable Cardiovascular status: blood pressure returned to baseline and stable Postop Assessment: spinal receding, no headache, no backache, no apparent nausea or vomiting and adequate PO intake Anesthetic complications: no   No notable events documented.   Last Vitals:  Vitals:   07/15/24 1015 07/15/24 1045  BP: 118/62 134/83  Pulse: (!) 58 (!) 57  Resp: 13 14  Temp:    SpO2: 94% 97%    Last Pain:  Vitals:   07/15/24 1045  TempSrc:   PainSc: 0-No pain                 Alfonso Ruths

## 2024-07-15 NOTE — H&P (Signed)
 History of Present Illness: The patient is an 63 y.o. female seen in clinic today for history and physical for right total knee arthroplasty with Dr. Lorelle on 07/15/2024. Patient has had severe debilitating pain that has been increasing over the last year. She has had cortisone injections with only a few weeks worth of relief. X-rays show severe degenerative changes with complete loss of joint space throughout the right knee. Patient's pain is interfered with her quality of life and activities day living.  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 Follows at [facility].  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 (CMS/HHS-HCC) 04/01/2019  Varicose veins of both lower extremities 03/28/2015   Past Surgical History: Past Surgical History:  Procedure Laterality Date  CARDIAC CATHETERIZATION 2011  normal  Limited arthroscopic debridement, arthroscopic subacromial decompression, mini-open repair of massive rotator cuff tear,right shoulder Right 09/28/2015  Dr.Poggi  COLONOSCOPY 12/15/2018  Normal colon/PHx CP/Repeat 34yrs/MUS  Hand surgery Left   Past Family History: Family History  Problem Relation Age of Onset  Myocardial Infarction (Heart attack) Father  Diabetes type II Father  Deceased  Lupus Mother  Deceased  High blood pressure (Hypertension) Other  Diabetes Other  Heart  disease Other  Diabetes type II Sister  High blood pressure (Hypertension) Sister  Diabetes type II Sister   Medications: Current Outpatient Medications  Medication Sig Dispense Refill  ACCU-CHEK GUIDE TEST STRIPS test strip USE 5 TIMES DAILY AS INSTRUCTED. 450 strip 3  acetaminophen  (TYLENOL ) 500 MG tablet Take 1,000 mg by mouth as needed for Pain.  albuterol 90 mcg/actuation inhaler Inhale 2 inhalations into the lungs every 6 (six) hours as needed for Wheezing (Patient not taking: Reported on 09/15/2023) 1 each 2  amLODIPine (NORVASC) 5 MG tablet TAKE 1 TABLET BY MOUTH EVERY DAY 90 tablet 1  aspirin 81 MG EC tablet Take 81 mg by mouth once daily.  CONTOUR NEXT METER Misc Use to check blood sugars daily (Patient not taking: Reported on 09/09/2022) 1 each 0  dapagliflozin propanediol (FARXIGA) 10 mg tablet Take 1 tablet (10 mg total) by mouth once daily 90 tablet 3  EPIPEN  2-PAK 0.3 mg/0.3 mL pen injector Inject 0.3 mg into the muscle as needed 3  insulin  ASPART (NOVOLOG  U-100 INSULIN  ASPART) injection (concentration 100 units/mL) USE VIA INSULIN  PUMP, MAXIMUM OF 120 UNITS PER DAY 110 mL 3  insulin  LISPRO (ADMELOG, HUMALOG) injection (concentration 100 units/mL) USE VIA INSULIN  PUMP, MAXIMUM OF 120 UNITS PER DAY (Patient not taking: Reported on 03/22/2024) 110 mL 3  lancets Use as instructed 8 times daily (Patient not taking: Reported on 09/09/2022) 900 each 3  losartan (COZAAR) 100 MG tablet TAKE 1 TABLET ONCE DAILY 90 tablet 1  metoprolol SUCCinate (TOPROL-XL) 100 MG XL tablet TAKE 1 TABLET ONCE DAILY 90 tablet 1  omeprazole (PRILOSEC) 40 MG DR capsule Take 40 mg by mouth once daily  simvastatin (ZOCOR) 20 MG tablet TAKE 1 TABLET ONCE DAILY 90 tablet 1   No current  facility-administered medications for this visit.   Allergies: Allergies  Allergen Reactions  Malt Extract Itching, Rash, Shortness Of Breath and Cough  Adhesive Itching  Azithromycin Diarrhea  Z-Pak  Glucophage [Metformin]  Diarrhea  Lactalbumin Other (See Comments)  Lovastatin Muscle Pain  Other Unknown  Decongestants  Tapentadol Itching  Vytorin 10-10 [Ezetimibe-Simvastatin] Muscle Pain and Other (See Comments)  joint pain  Wheat Cough  rash  Adhesive Tape-Silicones Itching and Rash    Visit Vitals: Vitals:  07/13/24 1546  BP: (!) 140/82    Review of Systems:  A comprehensive 14 point ROS was performed, reviewed, and the pertinent orthopaedic findings are documented in the HPI.  Physical Exam: General:  Well developed, well nourished, no apparent distress, normal affect, antalgic gait with no assistive device.  HEENT: Head normocephalic, atraumatic, PERRL.   Abdomen: Soft, non tender, non distended, Bowel sounds present.  Heart: Examination of the heart reveals regular, rate, and rhythm. There is no murmur noted on ascultation. There is a normal apical pulse.  Lungs: Lungs are clear to auscultation. There is no wheeze, rhonchi, or crackles. There is normal expansion of bilateral chest walls.   Comprehensive Knee Exam: Gait Non-antalgic and fluid  Alignment Neutral   Inspection Right Left  Skin Normal appearance with no obvious deformity. No ecchymosis or erythema. Normal appearance with no obvious deformity. No ecchymosis or erythema.  Soft Tissue No focal soft tissue swelling No focal soft tissue swelling  Quad Atrophy None None   Palpation  Right Left  Tenderness Medial joint line tenderness to palpation Mild medial joint line tenderness to palpation  Crepitus + patellofemoral and tibiofemoral crepitus No patellofemoral or tibiofemoral crepitus  Effusion None None   Range of Motion Right Left  Flexion 0-95 0-120  Extension Full knee extension without hyperextension Full knee extension without hyperextension   Ligamentous Exam Right Left  Lachman Normal Normal  Valgus 0 1+ with endpoint partially correctable varus deformity Normal  Valgus 30 Normal Normal  Varus 0  Normal Normal  Varus 30 Normal Normal  Anterior Drawer Normal Normal  Posterior Drawer Normal Normal   Meniscal Exam Right Left  Hyperflexion Test Positive Negative  Hyperextension Test Positive Positive  McMurray's Positive Negative   Neurovascular Right Left  Quadriceps Strength 5/5 5/5  Hamstring Strength 5/5 5/5  Hip Abductor Strength 4/5 4/5  Distal Motor Normal Normal  Distal Sensory Normal light touch sensation Normal light touch sensation  Distal Pulses Normal Normal    Imaging Studies: X-rays of the right knee reviewed by me today show severe degenerative changes with tricompartmental, sclerosis, osteophyte formation, and cystic changes. There is medial bone-on-bone articulation with lateral subluxation of the tibia. Kellgren-Lawrence grade 4. AP, sunrise, and flexed PA of the left knee also show tricompartmental degenerative changes with joint space narrowing, sclerosis, osteophyte formation and medial bone-on-bone articulation. There is cystic changes as well in the medial knee. Kellgren-Lawrence grade 4. No fractures or dislocations noted.   Assessment:  Advanced right knee osteoarthritis  Plan: Ajna is a 63 year old female presents with advanced right knee osteoarthritis. She has complete loss of joint space throughout the right knee. Pain is interfered with her quality of life and activities of daily living. She has had conservative treatment with very little relief. She is seen Dr. Lorelle, risks, benefits, complications of a right total knee arthroplasty have been discussed with the patient. Patient has agreed and consented to the procedure with Dr. Lorelle on 07/15/2024.  The hospitalization and post-operative care and  rehabilitation were also discussed. The use of perioperative antibiotics and DVT prophylaxis were discussed. The risk, benefits and alternatives to a surgical intervention were discussed at length with the patient. The patient was also advised of risks  related to the medical comorbidities and elevated body mass index (BMI). A lengthy discussion took place to review the most common complications including but not limited to: stiffness, loss of function, complex regional pain syndrome, deep vein thrombosis, pulmonary embolus, heart attack, stroke, infection, wound breakdown, numbness, intraoperative fracture, damage to nerves, tendon,muscles, arteries or other blood vessels, death and other possible complications from anesthesia. The patient was told that we will take steps to minimize these risks by using sterile technique, antibiotics and DVT prophylaxis when appropriate and follow the patient postoperatively in the office setting to monitor progress. The possibility of recurrent pain, no improvement in pain and actual worsening of pain were also discussed with the patient.

## 2024-07-15 NOTE — Op Note (Signed)
 Patient Name: Tiffany Ellis  FMW:[id number]  Pre-Operative Diagnosis: Right knee Osteoarthritis  Post-Operative Diagnosis: (same)  Procedure: Right Total Knee Arthroplasty  Components/Implants: Femur: Persona Size 9 CR PPS   Tibia: Persona Size F OsseoTi  Poly: 10mm MC  Patella: 35x9.33mm symmetric OsseoTi  Femoral Valgus Cut Angle: 5 degrees  Distal Femoral Re-cut: none  Patella Resurfacing: yes   Date of Surgery: 07/15/2024  Surgeon: Arthea Sheer MD  Assistant: Debby Amber PA (present and scrubbed throughout the case, critical for assistance with exposure, retraction, instrumentation, and closure)   Anesthesiologist: Mazzoni  Anesthesia: Spinal   Tourniquet Time: 47 min  EBL: 50cc  IVF: 450cc  Complications: None   Brief history: The patient is a 63 year old female with a history of osteoarthritis of the right knee with pain limiting their range of motion and activities of daily living, which has failed multiple attempts at conservative therapy.  The risks and benefits of total knee arthroplasty as definitive surgical treatment were discussed with the patient, who opted to proceed with the operation.  After outpatient medical clearance and optimization was completed the patient was admitted to [facility] for the procedure.  All preoperative films were reviewed and an appropriate surgical plan was made prior to surgery. Preoperative range of motion was 0 to 95.   Description of procedure: The patient was brought to the operating room where laterality was confirmed by all those present to be the right side.   Spinal anesthesia was administered and the patient received an intravenous dose of antibiotics for surgical prophylaxis and a dose of tranexamic acid.  Patient is positioned supine on the operating room table with all bony prominences well-padded.  A well-padded tourniquet was applied to the right thigh.  The knee was then prepped and draped in  usual sterile fashion with multiple layers of adhesive and nonadhesive drapes.  All of those present in the operating room participated in a surgical timeout laterality and patient were confirmed.   An Esmarch was wrapped around the extremity and the leg was elevated and the knee flexed.  The tourniquet was inflated to a pressure of 250 mmHg. The Esmarch was removed and the leg was brought down to full extension.  The patella and tibial tubercle identified and outlined using a marking pen and a midline skin incision was made with a knife carried through the subcutaneous tissue down to the extensor retinaculum.  After exposure of the extensor mechanism the medial parapatellar arthrotomy was performed with a scalpel and electrocautery extending down medial and distal to the tibial tubercle taking care to avoid incising the patellar tendon.   A standard medial release was performed over the proximal tibia.  The knee was brought into extension in order to excise the fat pad taking care not to damage the patella tendon.  The superior soft tissue was removed from the anterior surface of the distal femur to visualize for the procedure.  The knee was then brought into flexion with the patella subluxed laterally and subluxing the tibia anteriorly.  The ACL was transected and removed with electrocautery and additional soft tissue was removed from the proximal surface of the tibia to fully expose. The PCL was found to be partially intact and the remaining fibers were preserved.  An extramedullary tibial cutting guide was then applied to the leg with a spring-loaded ankle clamp placed around the distal tibia just above the malleoli the angulation of the guide was adjusted to give some posterior slope in the  tibial resection with an appropriate varus/valgus alignment.  The resection guide was then pinned to the proximal tibia and the proximal tibial surface was resected with an oscillating saw.  Careful attention was paid  to ensure the blade did not disrupt any of the soft tissues including any lateral or medial ligament.  Attention was then turned to the femur, with the knee slightly flexed a opening drill was used to enter the medullary canal of the femur.  After removing the drill marrow was suctioned out to decompress the distal femur.  An intramedullary femoral guide was then inserted into the drill hole and the alignment guide was seated firmly against the distal end of the medial femoral condyle.  The distal femoral cutting guide was then attached and pinned securely to the anterior surface of the femur and the intramedullary rod and alignment guide was removed.  Distal femur resection was then performed with an oscillating saw with retractors protecting medial and laterally.   The distal cutting block was then removed and the extension gap was checked with a spacer.  Extension gap was found to be appropriately sized to accommodate the spacer block.    The femoral sizing guide was then placed securely into the posterior condyles of the femur and the femoral size was measured and determined to be 9.  The size 9; 4-in-1 cutting guide was placed in position and secured with 2 pins.  The anterior posterior and chamfer resections were then performed with an oscillating saw.  Bony fragments and osteophytes were then removed.  Using a lamina spreader the posterior medial and lateral condyles were checked for additional osteophytes and posterior soft tissue remnants.  Any remaining meniscus was removed at this time.  Periarticular injection was performed in the meniscal rims and posterior capsule with aspiration performed to ensure no intravascular injection.   The tibia was then exposed and the tibial trial was pinned onto the plateau after confirming appropriate orientation and rotation.  Using the drill bushing the tibia was prepared to the appropriate drill depth.  Tibial broach impactor was then driven through the punch  guide using a mallet.  The femoral trial component was then inserted onto the femur.  A trial tibial polyethylene bearing was then placed and the knee was reduced.  The knee achieved full extension with no hyperextension and was found to be balanced in flexion and extension with the trials in place.  The knee was then brought into full extension the patella was everted and held with 2 Kocher clamps.  The articular surface of the patella was then resected with an patella reamer and saw after careful measurement with a caliper.  The patella was then prepared with the drill guide and a trial patella was placed.  The knee was then taken through range of motion and it was found that the patella articulated appropriately with the trochlea and good patellofemoral motion without subluxation.    The correct final components for implantation were confirmed and opened by the circulator nurse.  The knee was irrigated with normal saline via pulsatile lavage to remove any bony debris or soft tissue.  The prepared surface of the tibia was exposed and the tibial component was implanted with good bony contact.  The femoral component was then placed and impacted showing good coverage and a good snug fit.  The patella was then cleared off and the patella compression tool was used to apply the patellar component with symmetric compression onto the patella.  The tibial  component was then irrigated and cleared of any debris and a real polyethylene component was placed and engaged with the locking mechanism.  The knee was then injected with the particular cocktail.  The knee was taken through range of motion and found to be stable in flexion and extension with patellar tracking.  The knee was then irrigated with copious amount of normal saline via pulsatile lavage to remove all loose bodies and other debris.  The knee was then irrigated with surgiphor betadine based wash and reirrigated with saline.  The tourniquet was then dropped and  all bleeding vessels were identified and coagulated.  The arthrotomy was approximated with #1 Vicryl and closed with #1 Stratafix suture.  The knee was brought into slight flexion and the subcutaneous tissues were closed with 0 Vicryl, 2-0 Vicryl and a running subcuticular 4-0 stratafix barbed suture.  Skin was then glued with Dermabond.  A sterile adhesive dressing was then placed along with a sequential compression device to the calf, a Ted stocking, and a cryotherapy cuff.   Sponge, needle, and Lap counts were all correct at the end of the case.   The patient was transferred off of the operating room table to a hospital bed, good pulses were found distally on the operative side.  The patient was transferred to the recovery room in stable condition.

## 2024-07-15 NOTE — Progress Notes (Signed)
 Patient is not able to walk the distance required to go the bathroom, or she is unable to safely negotiate stairs required to access the bathroom.  A 3in1 BSC will alleviate this problem.       Lollie Marrow, PA-C [facility]

## 2024-07-15 NOTE — Transfer of Care (Signed)
 Immediate Anesthesia Transfer of Care Note  Patient: Tiffany Ellis  Procedure(s) Performed: ARTHROPLASTY, KNEE, TOTAL (Right: Knee)  Patient Location: PACU  Anesthesia Type:Spinal  Level of Consciousness: drowsy and patient cooperative  Airway & Oxygen Therapy: Patient Spontanous Breathing and Patient connected to face mask oxygen  Post-op Assessment: Report given to RN and Post -op Vital signs reviewed and stable  Post vital signs: Reviewed and stable  Last Vitals:  Vitals Value Taken Time  BP 93/60 07/15/24 09:45  Temp 36.7 C 07/15/24 09:45  Pulse 65 07/15/24 09:45  Resp 17 07/15/24 09:45  SpO2 99 % 07/15/24 09:45    Last Pain:  Vitals:   07/15/24 0945  TempSrc: Tympanic  PainSc: 0-No pain         Complications: No notable events documented.

## 2024-07-15 NOTE — Discharge Instructions (Addendum)
 Instructions after Total Knee Replacement   Arthea Sheer M.D.     Dept. of Orthopaedics & Sports Medicine  [facility]  [address]  [place], [place]  [address]  Phone: [phone]   Fax: [phone]    DIET: Drink plenty of non-alcoholic fluids. Resume your normal diet. Include foods high in fiber.  ACTIVITY:  You may use crutches or a walker with weight-bearing as tolerated, unless instructed otherwise. You may be weaned off of the walker or crutches by your Physical Therapist.  Do NOT place pillows under the knee. Anything placed under the knee could limit your ability to straighten the knee.   Continue doing gentle exercises. Exercising will reduce the pain and swelling, increase motion, and prevent muscle weakness.   Please continue to use the TED compression stockings for 2 weeks. You may remove the stockings at night, but should reapply them in the morning. Do not drive or operate any equipment until instructed.  WOUND CARE:  Continue to use the PolarCare or ice packs periodically to reduce pain and swelling. You may begin showering 3 days after surgery with honeycomb dressing. Remove honeycomb dressing 7 days after surgery and continue showering. Allow dermabond to fall off on its own.  MEDICATIONS: You may resume your regular medications. Please take the pain medication as prescribed on the medication. Do not take pain medication on an empty stomach. Do not drive or drink alcoholic beverages when taking pain medications.  POSTOPERATIVE CONSTIPATION PROTOCOL Constipation - defined medically as fewer than three stools per week and severe constipation as less than one stool per week.  One of the most common issues patients have following surgery is constipation.  Even if you have a regular bowel pattern at home, your normal regimen is likely to be disrupted due to multiple reasons following surgery.  Combination of anesthesia, postoperative narcotics,  change in appetite and fluid intake all can affect your bowels.  In order to avoid complications following surgery, here are some recommendations in order to help you during your recovery period.  Colace (docusate) - Pick up an over-the-counter form of Colace or another stool softener and take twice a day as long as you are requiring postoperative pain medications.  Take with a full glass of water daily.  If you experience loose stools or diarrhea, hold the colace until you stool forms back up.  If your symptoms do not get better within 1 week or if they get worse, check with your doctor.  Dulcolax (bisacodyl) - Pick up over-the-counter and take as directed by the product packaging as needed to assist with the movement of your bowels.  Take with a full glass of water.  Use this product as needed if not relieved by Colace only.   MiraLax (polyethylene glycol) - Pick up over-the-counter to have on hand.  MiraLax is a solution that will increase the amount of water in your bowels to assist with bowel movements.  Take as directed and can mix with a glass of water, juice, soda, coffee, or tea.  Take if you go more than two days without a movement. Do not use MiraLax more than once per day. Call your doctor if you are still constipated or irregular after using this medication for 7 days in a row.  If you continue to have problems with postoperative constipation, please contact the office for further assistance and recommendations.  If you experience the worst abdominal pain ever or develop nausea or vomiting, please contact the  office immediatly for further recommendations for treatment.   CALL THE OFFICE FOR: Temperature above 101 degrees Excessive bleeding or drainage on the dressing. Excessive swelling, coldness, or paleness of the toes. Persistent nausea and vomiting.  FOLLOW-UP:  You should have an appointment to return to the office in 14 days after surgery. Arrangements have been made for  continuation of Physical Therapy (either home therapy or outpatient therapy).    Information on my medicine - ELIQUIS (apixaban)   Why was Eliquis prescribed for you? Eliquis was prescribed for you to reduce the risk of blood clots forming after orthopedic surgery.    What do You need to know about Eliquis? Take your Eliquis TWICE DAILY - one tablet in the morning and one tablet in the evening with or without food.  It would be best to take the dose about the same time each day.  If you have difficulty swallowing the tablet whole please discuss with your pharmacist how to take the medication safely.  Take Eliquis exactly as prescribed by your doctor and DO NOT stop taking Eliquis without talking to the doctor who prescribed the medication.  Stopping without other medication to take the place of Eliquis may increase your risk of developing a clot.  After discharge, you should have regular check-up appointments with your healthcare provider that is prescribing your Eliquis.  What do you do if you miss a dose? If a dose of ELIQUIS is not taken at the scheduled time, take it as soon as possible on the same day and twice-daily administration should be resumed.  The dose should not be doubled to make up for a missed dose.  Do not take more than one tablet of ELIQUIS at the same time.  Important Safety Information A possible side effect of Eliquis is bleeding. You should call your healthcare provider right away if you experience any of the following: Bleeding from an injury or your nose that does not stop. Unusual colored urine (red or dark brown) or unusual colored stools (red or black). Unusual bruising for unknown reasons. A serious fall or if you hit your head (even if there is no bleeding).  Some medicines may interact with Eliquis and might increase your risk of bleeding or clotting while on Eliquis. To help avoid this, consult your healthcare provider or pharmacist prior to  using any new prescription or non-prescription medications, including herbals, vitamins, non-steroidal anti-inflammatory drugs (NSAIDs) and supplements.  This website has more information on Eliquis (apixaban): http://www.eliquis.com/eliquis/home

## 2024-07-15 NOTE — TOC Initial Note (Addendum)
 Transition of Care [facility]) - Initial/Assessment Note    Patient Details  Name: Tiffany Ellis MRN: [id number] Date of Birth: November 12, 1960  Transition of [facility]) CM/SW Contact:    [place] L Darlyn Repsher, LCSW Phone Number: 07/15/2024, 1:33 PM  Clinical Narrative:                    CSW spoke with Ms. Shoe, spouse of patient. She was bedside during this interaction. HHA services discussed. HHA was set up by the surgeons office. [facility] is the preferred provider.    DME discussed. Spouse declined BSC but advised that they would like the RW delivered to the home.   RW ordered through [employer].      Patient Goals and CMS Choice            Expected Discharge Plan and Services                                              Prior Living Arrangements/Services                       Activities of Daily Living   ADL Screening (condition at time of admission) Independently performs ADLs?: Yes (appropriate for developmental age) Is the patient deaf or have difficulty hearing?: No Does the patient have difficulty seeing, even when wearing glasses/contacts?: No Does the patient have difficulty concentrating, remembering, or making decisions?: No  Permission Sought/Granted                  Emotional Assessment              Admission diagnosis:  Primary osteoarthritis of right knee [M17.11] S/P total knee arthroplasty, 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 [place] [address] Phone: [phone] Fax: [phone]  [facility] [address] [place] [address] Phone: [phone] Fax: [phone]     Social Drivers of Health (SDOH) Social History: SDOH Screenings   Food Insecurity: No Food Insecurity  (07/15/2024)  Housing: Low Risk  (07/15/2024)  Transportation Needs: No Transportation Needs (07/15/2024)  Utilities: Not At Risk (07/15/2024)  Financial Resource Strain: Low Risk  (08/25/2023)   Received from [facility]  Social Connections: Unknown (07/15/2024)  Tobacco Use: Low Risk  (07/15/2024)   SDOH Interventions:     Readmission Risk Interventions     No data to display

## 2024-07-15 NOTE — Anesthesia Procedure Notes (Addendum)
 Spinal  Patient location during procedure: OR Start time: 07/15/2024 7:35 AM End time: 07/15/2024 7:41 AM Reason for block: surgical anesthesia Staffing Performed: resident/CRNA  Anesthesiologist: Shellie Odor, MD Resident/CRNA: Lorrene Camelia LABOR, CRNA Performed by: Lorrene Camelia LABOR, CRNA Authorized by: Shellie Odor, MD   Preanesthetic Checklist Completed: patient identified, IV checked, site marked, risks and benefits discussed, surgical consent, monitors and equipment checked and pre-op evaluation Spinal Block Patient position: sitting Prep: ChloraPrep and site prepped and draped Patient monitoring: heart rate, continuous pulse ox, blood pressure and cardiac monitor Approach: midline Location: L4-5 Injection technique: single-shot Needle Needle type: Introducer and Pencan  Needle gauge: 24 G Needle length: 9 cm Assessment Events: CSF return Additional Notes Negative paresthesia. Negative blood return. Positive free-flowing CSF. Expiration date of kit checked and confirmed. Patient tolerated procedure well, without complications.

## 2024-07-15 NOTE — Discharge Summary (Signed)
 Physician Discharge Summary  Patient ID: Tiffany Ellis MRN: [id number] DOB/AGE: 1961/06/17 63 y.o.  Admit date: 07/15/2024 Discharge date: 07/16/2024  Admission Diagnoses:  Primary osteoarthritis of right knee [M17.11] S/P total knee arthroplasty, right [Z96.651]   Discharge Diagnoses: Patient Active Problem List   Diagnosis Date Noted   S/P total knee arthroplasty, right 07/15/2024    Past Medical History:  Diagnosis Date   Anxiety    Complete rotator cuff tear    RIGHT   Diabetes mellitus without complication (HCC)    TYPE 1   GERD (gastroesophageal reflux disease)    History of seasonal allergies    Hypercholesteremia    Hypertension    PONV (postoperative nausea and vomiting)    Varicose veins of both lower extremities      Transfusion: none   Consultants (if any):   Discharged Condition: Improved  Hospital Course: Tiffany Ellis is an 63 y.o. female who was admitted 07/15/2024 with a diagnosis of S/P total knee arthroplasty, right and went to the operating room on 07/15/2024 and underwent the above named procedures.    Surgeries: Procedure(s): ARTHROPLASTY, KNEE, TOTAL on 07/15/2024 Patient tolerated the surgery well. Taken to PACU where she was stabilized and then transferred to the orthopedic floor.  Started on eliquis 2.5 mg BID, TEDs and SCDs applied bilaterally. Heels elevated on bed. No evidence of DVT. Negative Homan. Physical therapy started on day #1 for gait training and transfer. OT started day #1 for ADL and assisted devices.  Patient's IV was d/c on day #1. Patient was able to safely and independently complete all PT goals. PT recommending discharge to home.    On post op day #1 patient was stable and ready for discharge to home with HHPT.  Implants:  Femur: Persona Size 9 CR PPS   Tibia: Persona Size F OsseoTi  Poly: 10mm MC  Patella: 35x9.47mm symmetric OsseoTi   She was given perioperative antibiotics:  Anti-infectives (From admission, onward)     Start     Dose/Rate Route Frequency Ordered Stop   07/15/24 1330  ceFAZolin  (ANCEF ) IVPB 2g/100 mL premix        2 g 200 mL/hr over 30 Minutes Intravenous Every 6 hours 07/15/24 1139 07/16/24 0129   07/15/24 0615  ceFAZolin  (ANCEF ) IVPB 2g/100 mL premix        2 g 200 mL/hr over 30 Minutes Intravenous On call to O.R. 07/15/24 9392 07/15/24 0805     .  She was given sequential compression devices, early ambulation, and Eliquis, TEDs for DVT prophylaxis.  She benefited maximally from the hospital stay and there were no complications.    Recent vital signs:  Vitals:   07/15/24 1245 07/15/24 1312  BP:  (!) 155/69  Pulse: 60 (!) 59  Resp: 14 15  Temp: 97.6 F (36.4 C)   SpO2: 95% 99%    Recent laboratory studies:  Lab Results  Component Value Date   HGB 15.2 (H) 07/02/2024   HGB 14.0 09/21/2015   Lab Results  Component Value Date   WBC 8.6 07/02/2024   PLT 226 07/02/2024   No results found for: INR Lab Results  Component Value Date   NA 139 07/02/2024   K 3.6 07/02/2024   CL 105 07/02/2024   CO2 24 07/02/2024   BUN 24 (H) 07/02/2024   CREATININE 1.20 (H) 07/02/2024   GLUCOSE 155 (H) 07/02/2024    Discharge Medications:   Allergies as of 07/15/2024  Reactions   Adhesive [tape] Itching   Lovastatin Other (See Comments)   joint pain   Malt Cough   Metformin And Related Diarrhea   Milk-related Compounds Other (See Comments)   Vytorin [ezetimibe-simvastatin] Other (See Comments)   joint pain   Wheat Cough   rash        Medication List     TAKE these medications    acetaminophen  500 MG tablet Commonly known as: TYLENOL  Take 1,000 mg by mouth every 6 (six) hours as needed for moderate pain (pain score 4-6).   amLODipine 5 MG tablet Commonly known as: NORVASC Take 5 mg by mouth daily.   apixaban 2.5 MG Tabs tablet Commonly known as: ELIQUIS Take 1 tablet (2.5 mg total) by mouth every 12 (twelve) hours for 28 days. Start taking on:  July 16, 2024   aspirin 81 MG tablet Take 81 mg by mouth daily.   chlorhexidine 4 % external liquid Commonly known as: HIBICLENS Apply 15 mLs (1 Application total) topically as directed for 30 doses. Use as directed daily for 5 days every other week for 6 weeks.   docusate sodium 100 MG capsule Commonly known as: COLACE Take 1 capsule (100 mg total) by mouth 2 (two) times daily.   Farxiga 10 MG Tabs tablet Generic drug: dapagliflozin propanediol Take 10 mg by mouth daily.   fluticasone 50 MCG/ACT nasal spray Commonly known as: FLONASE Place 2 sprays into both nostrils daily as needed for allergies or rhinitis.   insulin  aspart 100 UNIT/ML injection Commonly known as: novoLOG  Inject 40-120 Units into the skin continuous.   insulin  pump Soln Inject into the skin. Novolog  Insulin  Pump   losartan 100 MG tablet Commonly known as: COZAAR Take 100 mg by mouth daily.   metoprolol succinate 100 MG 24 hr tablet Commonly known as: TOPROL-XL Take 100 mg by mouth daily.   mupirocin ointment 2 % Commonly known as: BACTROBAN Place 1 Application into the nose 2 (two) times daily for 60 doses. Use as directed 2 times daily for 5 days every other week for 6 weeks.   omeprazole 40 MG capsule Commonly known as: PRILOSEC Take 40 mg by mouth daily.   ondansetron  4 MG tablet Commonly known as: ZOFRAN  Take 1 tablet (4 mg total) by mouth every 6 (six) hours as needed for nausea.   oxyCODONE  5 MG immediate release tablet Commonly known as: Roxicodone  Take 0.5-1 tablets (2.5-5 mg total) by mouth every 6 (six) hours as needed for breakthrough pain.   simvastatin 20 MG tablet Commonly known as: ZOCOR Take 20 mg by mouth daily.   traMADol 50 MG tablet Commonly known as: ULTRAM Take 1 tablet (50 mg total) by mouth every 6 (six) hours as needed for moderate pain (pain score 4-6).               Durable Medical Equipment  (From admission, onward)           Start     Ordered    07/15/24 1029  For home use only DME Walker  Once       Question:  Patient needs a walker to treat with the following condition  Answer:  Status post total knee replacement   07/15/24 1028   07/15/24 1029  For home use only DME 3 n 1  Once        07/15/24 1028            Diagnostic Studies: DG Knee Right Port Result Date:  07/15/2024 CLINICAL DATA:  Status post total knee replacement EXAM: PORTABLE RIGHT KNEE - 1-2 VIEW COMPARISON:  None available FINDINGS: RIGHT total knee prosthesis is well seated without periprosthetic fracture or lucency. Soft tissue emphysema consistent with immediate postop status. IMPRESSION: Uncomplicated RIGHT total knee prosthesis with immediate postop changes. Electronically Signed   By: Aliene Lloyd M.D.   On: 07/15/2024 10:45    Disposition:      Follow-up Information     Charlene Debby BROCKS, PA-C Follow up in 2 week(s).   Specialties: Orthopedic Surgery, Emergency Medicine Contact information: [address] [place] [address] [phone]                  Signed: Debby BROCKS Charlene 07/15/2024, 1:34 PM

## 2024-07-15 NOTE — Plan of Care (Signed)
   Problem: Activity: Goal: Ability to avoid complications of mobility impairment will improve Outcome: Progressing   Problem: Pain Management: Goal: Pain level will decrease with appropriate interventions Outcome: Progressing

## 2024-07-15 NOTE — Anesthesia Preprocedure Evaluation (Addendum)
 Anesthesia Evaluation  Patient identified by MRN, date of birth, ID band Patient awake    Reviewed: Allergy & Precautions, NPO status , Patient's Chart, lab work & pertinent test results  History of Anesthesia Complications (+) PONV and history of anesthetic complications  Airway Mallampati: I   Neck ROM: Full    Dental no notable dental hx.    Pulmonary  Sarcoidosis    Pulmonary exam normal breath sounds clear to auscultation       Cardiovascular hypertension, Normal cardiovascular exam Rhythm:Regular Rate:Normal  ECG 07/02/24:  Sinus bradycardia Otherwise normal ECG When compared with ECG of 21-Sep-2015 09:20, No significant change was found   Neuro/Psych negative neurological ROS     GI/Hepatic ,GERD  ,,  Endo/Other  diabetes, Type 1, Insulin  Dependent  Obesity   Renal/GU Renal disease (stage III CKD)     Musculoskeletal  (+) Arthritis ,    Abdominal   Peds  Hematology negative hematology ROS (+)   Anesthesia Other Findings   Reproductive/Obstetrics                              Anesthesia Physical Anesthesia Plan  ASA: 3  Anesthesia Plan: Spinal   Post-op Pain Management:    Induction: Intravenous  PONV Risk Score and Plan: 4 or greater and Propofol  infusion, TIVA, Treatment may vary due to age or medical condition and Ondansetron   Airway Management Planned: Natural Airway and Nasal Cannula  Additional Equipment:   Intra-op Plan:   Post-operative Plan:   Informed Consent: I have reviewed the patients History and Physical, chart, labs and discussed the procedure including the risks, benefits and alternatives for the proposed anesthesia with the patient or authorized representative who has indicated his/her understanding and acceptance.       Plan Discussed with: CRNA  Anesthesia Plan Comments: (Plan for spinal and GA with natural airway, LMA/GETA backup.   Patient consented for risks of anesthesia including but not limited to:  - adverse reactions to medications - damage to eyes, teeth, lips or other oral mucosa - nerve damage due to positioning  - sore throat or hoarseness - headache, bleeding, infection, nerve damage 2/2 spinal - damage to heart, brain, nerves, lungs, other parts of body or loss of life  Informed patient about role of CRNA in peri- and intra-operative care.  Patient voiced understanding.)         Anesthesia Quick Evaluation

## 2024-07-15 NOTE — Interval H&P Note (Signed)
Patient history and physical updated. Consent reviewed including risks, benefits, and alternatives to surgery. Patient agrees with above plan to proceed with right total knee arthroplasty  

## 2024-07-15 NOTE — Evaluation (Signed)
 Physical Therapy Evaluation Patient Details Name: Tiffany Ellis MRN: [id number] DOB: Aug 19, 1961 Today's Date: 07/15/2024  History of Present Illness  Patient is a 63 year old female with right knee osteoarthritis s/p right TKA. PMH: diabetes type 1, arthritis, HTN  Clinical Impression  Patient is agreeable to PT evaluation. She lives with supportive spouse. Patient is independent at baseline without assistive device but has pain limitations with activity.  Today the patient is eager to get out of bed. Gait training initiated with education on proper rolling walker positioning and gait kinematics. Education on positioning of right knee to promote knee ROM and home exercise handout provided. Patient is hopeful for return home tomorrow. PT will continue to follow to maximize independence and decrease caregiver burden.       If plan is discharge home, recommend the following: Assist for transportation;Assistance with cooking/housework   Can travel by private Tax inspector (2 wheels)  Recommendations for Other Services       Functional Status Assessment Patient has had a recent decline in their functional status and demonstrates the ability to make significant improvements in function in a reasonable and predictable amount of time.     Precautions / Restrictions Precautions Precautions: Knee;Fall Precaution Booklet Issued: Yes (comment) Recall of Precautions/Restrictions: Intact Restrictions Weight Bearing Restrictions Per Provider Order: Yes RLE Weight Bearing Per Provider Order: Weight bearing as tolerated      Mobility  Bed Mobility Overal bed mobility: Needs Assistance Bed Mobility: Supine to Sit, Sit to Supine     Supine to sit: Modified independent (Device/Increase time) Sit to supine: Min assist   General bed mobility comments: assistance for RLE support to get back to bed    Transfers Overall transfer level: Needs  assistance Equipment used: Rolling walker (2 wheels) Transfers: Sit to/from Stand Sit to Stand: Contact guard assist, Supervision           General transfer comment: cues for hand placement for safety    Ambulation/Gait Ambulation/Gait assistance: Contact guard assist Gait Distance (Feet):  (10ft, 135ft) Assistive device: Rolling walker (2 wheels) Gait Pattern/deviations: Step-through pattern, Decreased stance time - right, Decreased stride length, Antalgic Gait velocity: decreased     General Gait Details: education on proper rolling walker positioning and reciprocal gait pattern  Stairs            Wheelchair Mobility     Tilt Bed    Modified Rankin (Stroke Patients Only)       Balance Overall balance assessment: Needs assistance Sitting-balance support: Feet supported Sitting balance-Leahy Scale: Good     Standing balance support: Bilateral upper extremity supported Standing balance-Leahy Scale: Fair Standing balance comment: one mild loss of balance initially after standing that is self corrected. otherwise, balance is fair with rolling walker for UE support                             Pertinent Vitals/Pain Pain Assessment Pain Assessment: Faces Faces Pain Scale: Hurts whole lot Pain Location: right knee Pain Descriptors / Indicators: Sore Pain Intervention(s): Limited activity within patient's tolerance, Monitored during session, Premedicated before session, Repositioned (encouraged polar care)    Home Living Family/patient expects to be discharged to:: Private residence Living Arrangements: Spouse/significant other Available Help at Discharge: Family;Available 24 hours/day Type of Home: House Home Access: Stairs to enter Entrance Stairs-Rails: Right;Left Entrance Stairs-Number of Steps: 2   Home  Layout: One level Home Equipment: Rollator (4 wheels);Shower seat      Prior Function Prior Level of Function : Independent/Modified  Independent;Working/employed             Mobility Comments: No device for ambulation, however patient was limited by knee pain. Works at [employer]       Extremity/Trunk Assessment   Upper Extremity Assessment Upper Extremity Assessment: Overall WFL for tasks assessed    Lower Extremity Assessment Lower Extremity Assessment: RLE deficits/detail RLE Deficits / Details: patient able to activate hip/knee/ankle movement RLE Sensation: WNL       Communication   Communication Communication: No apparent difficulties    Cognition Arousal: Alert Behavior During Therapy: WFL for tasks assessed/performed   PT - Cognitive impairments: No apparent impairments                         Following commands: Intact       Cueing Cueing Techniques: Verbal cues     General Comments General comments (skin integrity, edema, etc.): education on positioning of right knee to promote knee ROM. HEP issued    Exercises Total Joint Exercises Goniometric ROM: right knee flexion 86 degrees   Assessment/Plan    PT Assessment Patient needs continued PT services  PT Problem List Decreased strength;Decreased range of motion;Decreased activity tolerance;Decreased balance;Decreased mobility;Decreased knowledge of use of DME;Decreased safety awareness       PT Treatment Interventions DME instruction;Gait training;Stair training;Functional mobility training;Therapeutic exercise;Therapeutic activities;Balance training;Patient/family education    PT Goals (Current goals can be found in the Care Plan section)  Acute Rehab PT Goals Patient Stated Goal: to go home and return to work when ready PT Goal Formulation: With patient Time For Goal Achievement: 07/29/24 Potential to Achieve Goals: Good    Frequency BID     Co-evaluation               AM-PAC PT 6 Clicks Mobility  Outcome Measure Help needed turning from your back to your side while in a flat bed  without using bedrails?: None Help needed moving from lying on your back to sitting on the side of a flat bed without using bedrails?: A Little Help needed moving to and from a bed to a chair (including a wheelchair)?: A Little Help needed standing up from a chair using your arms (e.g., wheelchair or bedside chair)?: A Little Help needed to walk in hospital room?: A Little Help needed climbing 3-5 steps with a railing? : A Little 6 Click Score: 19    End of Session Equipment Utilized During Treatment: Gait belt Activity Tolerance: Patient tolerated treatment well Patient left: in bed;with call bell/phone within reach;with family/visitor present;with SCD's reapplied (SCD LLE, polar care right knee) Nurse Communication: Mobility status PT Visit Diagnosis: Difficulty in walking, not elsewhere classified (R26.2);Other abnormalities of gait and mobility (R26.89)    Time: 1455-1520 PT Time Calculation (min) (ACUTE ONLY): 25 min   Charges:   PT Evaluation $PT Eval Low Complexity: 1 Low PT Treatments $Gait Training: 8-22 mins PT General Charges $$ ACUTE PT VISIT: 1 Visit         Randine Essex, PT, MPT   Randine LULLA Essex 07/15/2024, 3:41 PM

## 2024-07-16 ENCOUNTER — Encounter: Payer: Self-pay | Admitting: Orthopedic Surgery

## 2024-07-16 DIAGNOSIS — M1711 Unilateral primary osteoarthritis, right knee: Secondary | ICD-10-CM | POA: Diagnosis not present

## 2024-07-16 LAB — CBC
HCT: 41.1 % (ref 36.0–46.0)
Hemoglobin: 13.7 g/dL (ref 12.0–15.0)
MCH: 30.2 pg (ref 26.0–34.0)
MCHC: 33.3 g/dL (ref 30.0–36.0)
MCV: 90.5 fL (ref 80.0–100.0)
Platelets: 212 K/uL (ref 150–400)
RBC: 4.54 MIL/uL (ref 3.87–5.11)
RDW: 13.2 % (ref 11.5–15.5)
WBC: 16.4 K/uL — ABNORMAL HIGH (ref 4.0–10.5)
nRBC: 0 % (ref 0.0–0.2)

## 2024-07-16 LAB — GLUCOSE, CAPILLARY
Glucose-Capillary: 242 mg/dL — ABNORMAL HIGH (ref 70–99)
Glucose-Capillary: 279 mg/dL — ABNORMAL HIGH (ref 70–99)
Glucose-Capillary: 298 mg/dL — ABNORMAL HIGH (ref 70–99)

## 2024-07-16 LAB — BASIC METABOLIC PANEL WITH GFR
Anion gap: 8 (ref 5–15)
BUN: 23 mg/dL (ref 8–23)
CO2: 21 mmol/L — ABNORMAL LOW (ref 22–32)
Calcium: 8.8 mg/dL — ABNORMAL LOW (ref 8.9–10.3)
Chloride: 105 mmol/L (ref 98–111)
Creatinine, Ser: 1.22 mg/dL — ABNORMAL HIGH (ref 0.44–1.00)
GFR, Estimated: 49 mL/min — ABNORMAL LOW (ref >60)
Glucose, Bld: 289 mg/dL — ABNORMAL HIGH (ref 70–99)
Potassium: 4.2 mmol/L (ref 3.5–5.1)
Sodium: 134 mmol/L — ABNORMAL LOW (ref 135–145)

## 2024-07-16 MED ORDER — PANTOPRAZOLE SODIUM 40 MG PO TBEC
DELAYED_RELEASE_TABLET | ORAL | Status: AC
  Filled 2024-07-16: qty 1

## 2024-07-16 MED ORDER — ASPIRIN 81 MG PO CHEW
CHEWABLE_TABLET | ORAL | Status: AC
  Filled 2024-07-16: qty 1

## 2024-07-16 MED ORDER — AMLODIPINE BESYLATE 5 MG PO TABS
ORAL_TABLET | ORAL | Status: AC
  Filled 2024-07-16: qty 1

## 2024-07-16 NOTE — Progress Notes (Addendum)
 Physical Therapy Treatment Patient Details Name: Tiffany Ellis MRN: [id number] DOB: [birth year] Today's Date: 07/16/2024   History of Present Illness Patient is a 63 year old female with right knee osteoarthritis s/p right TKA. PMH: diabetes type 1, arthritis, HTN     Pt was long sitting in bed upon arrival. Endorses 8/10 pain but still agreeable to OOB and session. Pt demonstrated safe abilities to exit bed, stand, and ambulate with use of RW. Overall she tolerates session well and is able to perform stairs to simulate home entry/exit. Pt is cleared form an acute PT standpoint for safe DC home with HHPT to follow.    If plan is discharge home, recommend the following: Assist for transportation;Assistance with cooking/housework     Equipment Recommendations  Rolling walker (2 wheels)       Precautions / Restrictions Precautions Precautions: Knee;Fall Precaution Booklet Issued: Yes (comment) Recall of Precautions/Restrictions: Intact Restrictions Weight Bearing Restrictions Per Provider Order: Yes RLE Weight Bearing Per Provider Order: Weight bearing as tolerated     Mobility  Bed Mobility Overal bed mobility: Needs Assistance Bed Mobility: Supine to Sit, Sit to Supine  Supine to sit: Modified independent (Device/Increase time) Sit to supine: Supervision, Used rails General bed mobility comments: pt has hospital bed at home and plans to use for first few days    Transfers Overall transfer level: Needs assistance Equipment used: Rolling walker (2 wheels) Transfers: Sit to/from Stand Sit to Stand: Supervision  General transfer comment: cues for hand placement for safety    Ambulation/Gait Ambulation/Gait assistance: Supervision Gait Distance (Feet): 200 Feet Assistive device: Rolling walker (2 wheels) Gait Pattern/deviations: Step-through pattern, Decreased stance time - right, Decreased stride length, Antalgic Gait velocity: decreased  General Gait Details: pt demonstrated  safe steady gait kinematics without LOB or safety concerns    Balance Overall balance assessment: Needs assistance Sitting-balance support: Feet supported Sitting balance-Leahy Scale: Good     Standing balance support: Bilateral upper extremity supported Standing balance-Leahy Scale: Fair Standing balance comment: one mild loss of balance initially after standing that is self corrected. otherwise, balance is fair with rolling walker for UE support       Communication Communication Communication: No apparent difficulties  Cognition Arousal: Alert Behavior During Therapy: WFL for tasks assessed/performed   PT - Cognitive impairments: No apparent impairments      PT - Cognition Comments: Pt is A and O x 4. Cooperative and motivated throughout Following commands: Intact      Cueing Cueing Techniques: Verbal cues         Pertinent Vitals/Pain Pain Assessment Pain Assessment: 0-10 Faces Pain Scale: Hurts whole lot Pain Location: right knee Pain Descriptors / Indicators: Sore     PT Goals (current goals can now be found in the care plan section) Acute Rehab PT Goals Patient Stated Goal: to go home and return to work when ready    Frequency    BID       AM-PAC PT 6 Clicks Mobility   Outcome Measure  Help needed turning from your back to your side while in a flat bed without using bedrails?: None Help needed moving from lying on your back to sitting on the side of a flat bed without using bedrails?: A Little Help needed moving to and from a bed to a chair (including a wheelchair)?: A Little Help needed standing up from a chair using your arms (e.g., wheelchair or bedside chair)?: A Little Help needed to walk in hospital room?:  A Little Help needed climbing 3-5 steps with a railing? : A Little 6 Click Score: 19    End of Session Equipment Utilized During Treatment: Gait belt Activity Tolerance: Patient tolerated treatment well Patient left: in bed;with call  bell/phone within reach;with family/visitor present;with SCD's reapplied (SCD LLE, polar care right knee) Nurse Communication: Mobility status PT Visit Diagnosis: Difficulty in walking, not elsewhere classified (R26.2);Other abnormalities of gait and mobility (R26.89)     Time: 9266-9192 PT Time Calculation (min) (ACUTE ONLY): 34 min  Charges:    $Gait Training: 23-37 mins PT General Charges $$ ACUTE PT VISIT: 1 Visit                    Rankin Essex PTA 07/16/24, 8:54 AM

## 2024-07-16 NOTE — Progress Notes (Signed)
 Discharge Summary for Tiffany Ellis  Discharge Plan: Patient will be discharged home as per the MD's order. We discussed prescriptions and follow-up appointments with the patient. The prescriptions were provided, and the medication list was explained in detail. Patient confirmed understanding of the instructions.  Skin Assessment: The patient's skin is clean, dry, and intact, with no signs of breakdown or tears. The IV catheter was removed, and the skin remains intact. The site shows no signs or symptoms of complications. A dressing and pressure were applied to the site. Patient reports no pain and has no complaints.  After-Visit Summary: An After-Visit Summary was printed and given to the patient. The following items were sent home with patient:  Front wheel rolling walker [employer] Two TED hose placed on both patient legs  The patient was escorted via wheelchair and discharged home in a private vehicle.  Tiffany Ellis D. Geofm, RN

## 2024-07-16 NOTE — Inpatient Diabetes Management (Signed)
 Inpatient Diabetes Program Recommendations  AACE/ADA: New Consensus Statement on Inpatient Glycemic Control (2015)  Target Ranges:  Prepandial:   less than 140 mg/dL      Peak postprandial:   less than 180 mg/dL (1-2 hours)      Critically ill patients:  140 - 180 mg/dL   Lab Results  Component Value Date   GLUCAP 242 (H) 07/16/2024   Spoke with patient regarding any questions or needs for diabetes management. Patient is being discharged today. Patient has all insulin  pump supplies and CBGs are improving with covering q 4 hrs. With insulin  post steroid received. No needs @ this time.  Thank you, Kwanza Cancelliere E. Lynne Righi, RN, MSN, CNS, CDCES  Diabetes Coordinator Inpatient Glycemic Control Team Team Pager [phone] (8am-5pm) 07/16/2024 9:02 AM

## 2024-07-16 NOTE — Plan of Care (Signed)
  Problem: Health Behavior/Discharge Planning: Goal: Ability to manage health-related needs will improve Outcome: Progressing   Problem: Activity: Goal: Risk for activity intolerance will decrease Outcome: Progressing   Problem: Pain Managment: Goal: General experience of comfort will improve and/or be controlled Outcome: Progressing   Problem: Safety: Goal: Ability to remain free from injury will improve Outcome: Progressing   Problem: Activity: Goal: Ability to avoid complications of mobility impairment will improve Outcome: Progressing Goal: Range of joint motion will improve Outcome: Progressing   Problem: Clinical Measurements: Goal: Postoperative complications will be avoided or minimized Outcome: Progressing   Problem: Pain Management: Goal: Pain level will decrease with appropriate interventions Outcome: Progressing   Problem: Skin Integrity: Goal: Will show signs of wound healing Outcome: Progressing

## 2024-07-16 NOTE — Progress Notes (Signed)
   Subjective: 1 Day Post-Op Procedure(s) (LRB): ARTHROPLASTY, KNEE, TOTAL (Right) Patient reports pain as mild.   Patient is well, and has had no acute complaints or problems Denies any CP, SOB, ABD pain. We will continue therapy today.  Plan is to go Home after hospital stay.  Objective: Vital signs in last 24 hours: Temp:  [97 F (36.1 C)-98 F (36.7 C)] 97 F (36.1 C) (10/03 0444) Pulse Rate:  [53-77] 77 (10/03 0444) Resp:  [11-18] 16 (10/03 0444) BP: (93-155)/(52-83) 146/53 (10/03 0444) SpO2:  [92 %-99 %] 92 % (10/03 0444)  Intake/Output from previous day: 10/02 0701 - 10/03 0700 In: 3559.5 [P.O.:665; I.V.:2394.5; IV Piggyback:500] Out: 875 [Urine:825; Blood:50] Intake/Output this shift: No intake/output data recorded.  Recent Labs    07/16/24 0542  HGB 13.7   Recent Labs    07/16/24 0542  WBC 16.4*  RBC 4.54  HCT 41.1  PLT 212   Recent Labs    07/16/24 0542  NA 134*  K 4.2  CL 105  CO2 21*  BUN 23  CREATININE 1.22*  GLUCOSE 289*  CALCIUM 8.8*   No results for input(s): LABPT, INR in the last 72 hours.  EXAM General - Patient is Alert, Appropriate, and Oriented Extremity - Neurovascular intact Sensation intact distally Intact pulses distally Dorsiflexion/Plantar flexion intact Dressing - dressing C/D/I and no drainage Motor Function - intact, moving foot and toes well on exam.   Past Medical History:  Diagnosis Date   Anxiety    Complete rotator cuff tear    RIGHT   Diabetes mellitus without complication (HCC)    TYPE 1   GERD (gastroesophageal reflux disease)    History of seasonal allergies    Hypercholesteremia    Hypertension    PONV (postoperative nausea and vomiting)    Varicose veins of both lower extremities     Assessment/Plan:   1 Day Post-Op Procedure(s) (LRB): ARTHROPLASTY, KNEE, TOTAL (Right) Principal Problem:   S/P total knee arthroplasty, right  Estimated body mass index is 36.73 kg/m as calculated from the  following:   Height as of this encounter: 5' 7.5 (1.715 m).   Weight as of this encounter: 108 kg. Advance diet Up with therapy, Successful completion of PT goals this morning. Pain well-controlled. Labs and vital signs are stable Care management to assist with discharge to home with home health PT today.  DVT Prophylaxis - TED hose and SCDS, Eliquis Weight-Bearing as tolerated to right leg   T. Medford Amber, PA-C [facility] 07/16/2024, 8:02 AM

## 2024-07-16 NOTE — Plan of Care (Signed)
  Problem: Clinical Measurements: Goal: Respiratory complications will improve Outcome: Progressing   Problem: Activity: Goal: Risk for activity intolerance will decrease Outcome: Progressing   Problem: Coping: Goal: Level of anxiety will decrease Outcome: Progressing   Problem: Pain Managment: Goal: General experience of comfort will improve and/or be controlled Outcome: Progressing

## 2024-09-15 ENCOUNTER — Other Ambulatory Visit: Payer: Self-pay | Admitting: Physician Assistant

## 2024-09-15 DIAGNOSIS — Z1231 Encounter for screening mammogram for malignant neoplasm of breast: Secondary | ICD-10-CM
